# Patient Record
Sex: Female | Born: 1935 | Race: White | Hispanic: No | Smoking: Never smoker
Health system: Southern US, Community
[De-identification: ages and names within clinical notes are randomized; demographics above are authoritative.]

## PROBLEM LIST (undated history)

## (undated) DIAGNOSIS — I10 Essential (primary) hypertension: Secondary | ICD-10-CM

## (undated) DIAGNOSIS — E7800 Pure hypercholesterolemia, unspecified: Secondary | ICD-10-CM

## (undated) HISTORY — PX: NO PAST SURGERIES: SHX2092

---

## 2003-11-29 ENCOUNTER — Other Ambulatory Visit: Admission: RE | Admit: 2003-11-29 | Discharge: 2003-11-29 | Payer: Self-pay | Admitting: Family Medicine

## 2004-02-06 ENCOUNTER — Emergency Department (HOSPITAL_COMMUNITY): Admission: EM | Admit: 2004-02-06 | Discharge: 2004-02-06 | Payer: Self-pay | Admitting: Emergency Medicine

## 2005-08-11 ENCOUNTER — Emergency Department (HOSPITAL_COMMUNITY): Admission: EM | Admit: 2005-08-11 | Discharge: 2005-08-11 | Payer: Self-pay | Admitting: Emergency Medicine

## 2007-03-27 ENCOUNTER — Emergency Department (HOSPITAL_COMMUNITY): Admission: EM | Admit: 2007-03-27 | Discharge: 2007-03-27 | Payer: Self-pay | Admitting: Emergency Medicine

## 2007-06-19 ENCOUNTER — Encounter: Admission: RE | Admit: 2007-06-19 | Discharge: 2007-06-19 | Payer: Self-pay | Admitting: Orthopedic Surgery

## 2007-06-22 ENCOUNTER — Ambulatory Visit (HOSPITAL_BASED_OUTPATIENT_CLINIC_OR_DEPARTMENT_OTHER): Admission: RE | Admit: 2007-06-22 | Discharge: 2007-06-22 | Payer: Self-pay | Admitting: Orthopedic Surgery

## 2011-03-12 NOTE — Op Note (Signed)
NAMEENIS, LEATHERWOOD               ACCOUNT NO.:  [id number]   MEDICAL RECORD NO.:  [id number]          PATIENT TYPE:  AMB   LOCATION:  DSC                          FACILITY:  [facility]   PHYSICIAN:  Feliberto Gottron. Turner Daniels, M.D.   DATE OF BIRTH:  February 18, 1933   DATE OF PROCEDURE:  06/22/2007  DATE OF DISCHARGE:                               OPERATIVE REPORT   PREOPERATIVE DIAGNOSIS:  Chondromalacia with lateral meniscal tear,  right knee.   POSTOPERATIVE DIAGNOSES:  Chondromalacia with lateral meniscal tear,  right knee; with the addition of a medial meniscus posterior horn tear  and some cartilage loose bodies.  Chondromalacia was grade 4 to the end  of the medial femoral condyle,  grade 3 to the lateral tibial plateau,  grade 2-3 to the patella and grade 2-3 to the trochlea.   PROCEDURES:  1. Right knee arthroscopic partial medial and lateral meniscectomy.  2. Removal of loose bodies.  3. Debridement of chondromalacia flap tears from the lateral tibial      plateau, patella and trochlea.   SURGEON:  Feliberto Gottron. Turner Daniels, M.D.   FIRST ASSISTANT:  Skip Mayer PA-C.   ANESTHETIC:  General endotracheal.   ESTIMATED BLOOD LOSS:  Minimal.   FLUID REPLACEMENT:  800 mL crystalloid.   DRAINS PLACED:  None.   TOURNIQUET TIME:  None.   INDICATIONS FOR PROCEDURE:  A 77 year old woman with medial and lateral  joint line pain of her right knee.  MRI scan was consistent with a  fairly impressive lateral meniscal tear, as well as some chondromalacia.  On x-ray she has lost about half of her cartilage height.  She is not  interested in a knee replacement and desires elective arthroscopic  debridement of her right knee to decrease pain and increase function.  The risks and benefits of surgery were discussed, questions answered.   DESCRIPTION OF PROCEDURE:  The patient identified by armband, taken to  the operating room at [facility].  Appropriate  anesthetic monitors were attached  and general endotracheal anesthesia  was induced, with the patient in the supine position.  A lateral post  was applied to the table.  The right lower extremity was prepped and  draped in usual sterile fashion from the ankle to the mid thigh.  Using  a #11 blade, a standard inferomedial and inferolateral peripatellar  portals were then made, allowing introduction of the arthroscope through  the inferolateral portal and the outflow through the inferomedial  portal.  Diagnostic arthroscopy revealed grade 2-3 chondromalacia of the  apex of the patella and trochlea.  This was debrided back to a stable  margin with a 3.5 gator sucker shaver.  Moving into the medial  compartment, the distal aspect of the medial femoral condyle was down to  bare bone.  There were some flap tears as we went from distal to  posterior.  These were debrided back to a stable margin.  the posterior  horn of the medial meniscus had some radial tears that were debrided  back to a stable margin; and some cartilaginous loose bodies were  also  removed.  The ACL and the PCL were grossly intact on the lateral side;  grade 3 chondromalacia with flap tears to the lateral tibial plateau.  This was debrided back to a stable margin with a 3.5 gator sucker shaver  and a 4.2 great white sucker shaver.  Complex tearing of the lateral and  posterior horns of the lateral meniscus were debrided back to stable  margins with the large and small biter, and the gator and 4.2 great  white sucker shaver.  The gutters were cleared medially and laterally.  The ACL and PCL were probed and found to be intact.  Some small bleeders  in the synovium were identified and cauterized with a hooked Bovie.  The  knee was irrigated out with normal saline solution.  The arthroscopic  instruments were removed, and a dressing of Xeroform 4x4 dressing  sponges, Webril and an Ace wrap were applied.  The patient was then  awakened and taken to the recovery room  without difficulty.      Feliberto Gottron. Turner Daniels, M.D.  Electronically Signed     FJR/MEDQ  D:  06/22/2007  T:  06/22/2007  Job:  [id number]

## 2011-05-26 ENCOUNTER — Encounter: Payer: Self-pay | Admitting: *Deleted

## 2011-05-26 ENCOUNTER — Emergency Department (HOSPITAL_BASED_OUTPATIENT_CLINIC_OR_DEPARTMENT_OTHER)
Admission: EM | Admit: 2011-05-26 | Discharge: 2011-05-27 | Disposition: A | Discharge status: Home / Self Care | Payer: Medicare Other | Attending: Emergency Medicine | Admitting: Emergency Medicine

## 2011-05-26 DIAGNOSIS — M8448XA Pathological fracture, other site, initial encounter for fracture: Secondary | ICD-10-CM | POA: Insufficient documentation

## 2011-05-26 DIAGNOSIS — S32020A Wedge compression fracture of second lumbar vertebra, initial encounter for closed fracture: Secondary | ICD-10-CM

## 2011-05-26 DIAGNOSIS — I10 Essential (primary) hypertension: Secondary | ICD-10-CM | POA: Insufficient documentation

## 2011-05-26 DIAGNOSIS — Z9181 History of falling: Secondary | ICD-10-CM

## 2011-05-26 DIAGNOSIS — E7800 Pure hypercholesterolemia, unspecified: Secondary | ICD-10-CM | POA: Insufficient documentation

## 2011-05-26 HISTORY — DX: Pure hypercholesterolemia, unspecified: E78.00

## 2011-05-26 HISTORY — DX: Essential (primary) hypertension: I10

## 2011-05-26 NOTE — ED Provider Notes (Signed)
History     Chief Complaint  Patient presents with  . Fall   HPI Comments: Complains of pain in low back.  No radiation into legs.  No bowel or bladder complaints.   Patient is a 77 y.o. female presenting with fall. The history is provided by the patient.  Fall The accident occurred 3 to 5 hours ago. Incident: tangled in dog's leash, fell onto buttocks. Distance fallen: from standing. She landed on grass. Point of impact: buttocks.    Past Medical History  Diagnosis Date  . Hypertension   . High cholesterol     No past surgical history on file.  No family history on file.  History  Substance Use Topics  . Smoking status: Never Smoker   . Smokeless tobacco: Not on file  . Alcohol Use: No    OB History    Grav Para Term Preterm Abortions TAB SAB Ect Mult Living                  Review of Systems  Gastrointestinal: Negative.   Genitourinary: Negative.   Musculoskeletal:       As per hpi.  Neurological: Negative.   All other systems reviewed and are negative.    Physical Exam  BP 225/93  Pulse 68  Temp(Src) 98.6 F (37 C) (Oral)  Resp 20  SpO2 98%  Physical Exam  ED Course  Procedures  MDM Xrays show a mild L2 compression fracture with no signs of neuro compromise.  Will treat with pain meds, discharge to home.      Geoffery Lyons, MD 05/27/11 [phone]

## 2011-05-26 NOTE — ED Notes (Signed)
Got tangled in her dogs leash. Fell backward onto her buttocks. C.o pressure to her buttocks.

## 2011-05-27 ENCOUNTER — Emergency Department (INDEPENDENT_AMBULATORY_CARE_PROVIDER_SITE_OTHER): Payer: Medicare Other

## 2011-05-27 ENCOUNTER — Encounter (HOSPITAL_BASED_OUTPATIENT_CLINIC_OR_DEPARTMENT_OTHER): Payer: Self-pay | Admitting: *Deleted

## 2011-05-27 DIAGNOSIS — S32009A Unspecified fracture of unspecified lumbar vertebra, initial encounter for closed fracture: Secondary | ICD-10-CM

## 2011-05-27 DIAGNOSIS — N949 Unspecified condition associated with female genital organs and menstrual cycle: Secondary | ICD-10-CM

## 2011-05-27 DIAGNOSIS — W19XXXA Unspecified fall, initial encounter: Secondary | ICD-10-CM

## 2011-05-27 DIAGNOSIS — M47817 Spondylosis without myelopathy or radiculopathy, lumbosacral region: Secondary | ICD-10-CM

## 2011-05-27 MED ORDER — HYDROCODONE-ACETAMINOPHEN 5-325 MG PO TABS
2.0000 | ORAL_TABLET | Freq: Once | ORAL | Status: AC
  Administered 2011-05-27: 2 via ORAL
  Filled 2011-05-27: qty 2

## 2011-05-27 MED ORDER — HYDROCODONE-ACETAMINOPHEN 5-500 MG PO TABS: 1.0000 | ORAL_TABLET | Freq: Four times a day (QID) | ORAL | Status: AC | PRN

## 2011-05-27 NOTE — ED Notes (Signed)
Pt got tripped up last Pm and fell onto her buttocks pt presents with low back pain

## 2011-08-09 LAB — POCT HEMOGLOBIN-HEMACUE
Hemoglobin: 15.5 — ABNORMAL HIGH
Operator id: 112821

## 2011-08-09 LAB — BASIC METABOLIC PANEL
BUN: 21
CO2: 31
Calcium: 9.3
Chloride: 104
Creatinine, Ser: 0.99
GFR calc Af Amer: >60
GFR calc non Af Amer: 55 — ABNORMAL LOW
Glucose, Bld: 167 — ABNORMAL HIGH
Potassium: 4.4
Sodium: 141

## 2011-08-26 ENCOUNTER — Other Ambulatory Visit (HOSPITAL_BASED_OUTPATIENT_CLINIC_OR_DEPARTMENT_OTHER): Payer: Self-pay | Admitting: Family Medicine

## 2011-08-26 DIAGNOSIS — R410 Disorientation, unspecified: Secondary | ICD-10-CM

## 2011-08-27 ENCOUNTER — Other Ambulatory Visit (HOSPITAL_BASED_OUTPATIENT_CLINIC_OR_DEPARTMENT_OTHER): Payer: Medicare Other

## 2011-08-31 ENCOUNTER — Ambulatory Visit (HOSPITAL_BASED_OUTPATIENT_CLINIC_OR_DEPARTMENT_OTHER)
Admission: RE | Admit: 2011-08-31 | Discharge: 2011-08-31 | Disposition: A | Discharge status: Home / Self Care | Payer: Medicare Other | Source: Ambulatory Visit | Attending: Family Medicine | Admitting: Family Medicine

## 2011-08-31 DIAGNOSIS — R4789 Other speech disturbances: Secondary | ICD-10-CM | POA: Insufficient documentation

## 2011-08-31 DIAGNOSIS — I679 Cerebrovascular disease, unspecified: Secondary | ICD-10-CM | POA: Insufficient documentation

## 2011-08-31 DIAGNOSIS — R410 Disorientation, unspecified: Secondary | ICD-10-CM

## 2011-08-31 DIAGNOSIS — R413 Other amnesia: Secondary | ICD-10-CM | POA: Insufficient documentation

## 2011-08-31 DIAGNOSIS — Z8673 Personal history of transient ischemic attack (TIA), and cerebral infarction without residual deficits: Secondary | ICD-10-CM | POA: Insufficient documentation

## 2011-08-31 DIAGNOSIS — I6789 Other cerebrovascular disease: Secondary | ICD-10-CM

## 2011-08-31 DIAGNOSIS — F29 Unspecified psychosis not due to a substance or known physiological condition: Secondary | ICD-10-CM | POA: Insufficient documentation

## 2012-05-12 ENCOUNTER — Inpatient Hospital Stay (HOSPITAL_COMMUNITY): Payer: Medicare Other

## 2012-05-12 ENCOUNTER — Inpatient Hospital Stay (HOSPITAL_COMMUNITY)
Admission: AD | Admit: 2012-05-12 | Discharge: 2012-05-15 | DRG: 064 | Disposition: A | Discharge status: Home / Self Care | Payer: Medicare Other | Source: Ambulatory Visit | Attending: Internal Medicine | Admitting: Internal Medicine

## 2012-05-12 ENCOUNTER — Encounter (HOSPITAL_COMMUNITY): Payer: Self-pay | Admitting: Internal Medicine

## 2012-05-12 DIAGNOSIS — I10 Essential (primary) hypertension: Secondary | ICD-10-CM | POA: Diagnosis present

## 2012-05-12 DIAGNOSIS — M6281 Muscle weakness (generalized): Secondary | ICD-10-CM

## 2012-05-12 DIAGNOSIS — G936 Cerebral edema: Secondary | ICD-10-CM | POA: Diagnosis present

## 2012-05-12 DIAGNOSIS — E860 Dehydration: Secondary | ICD-10-CM | POA: Diagnosis present

## 2012-05-12 DIAGNOSIS — R29898 Other symptoms and signs involving the musculoskeletal system: Secondary | ICD-10-CM | POA: Diagnosis present

## 2012-05-12 DIAGNOSIS — I951 Orthostatic hypotension: Secondary | ICD-10-CM | POA: Clinically undetermined

## 2012-05-12 DIAGNOSIS — I519 Heart disease, unspecified: Secondary | ICD-10-CM | POA: Diagnosis present

## 2012-05-12 DIAGNOSIS — E119 Type 2 diabetes mellitus without complications: Secondary | ICD-10-CM | POA: Diagnosis present

## 2012-05-12 DIAGNOSIS — Z7982 Long term (current) use of aspirin: Secondary | ICD-10-CM

## 2012-05-12 DIAGNOSIS — I619 Nontraumatic intracerebral hemorrhage, unspecified: Principal | ICD-10-CM | POA: Diagnosis present

## 2012-05-12 DIAGNOSIS — E7800 Pure hypercholesterolemia, unspecified: Secondary | ICD-10-CM | POA: Diagnosis present

## 2012-05-12 LAB — COMPREHENSIVE METABOLIC PANEL
AST: 21 U/L (ref 0–37)
Albumin: 3.3 g/dL — ABNORMAL LOW (ref 3.5–5.2)
Calcium: 9.1 mg/dL (ref 8.4–10.5)
Creatinine, Ser: 0.93 mg/dL (ref 0.50–1.10)
Total Protein: 6.4 g/dL (ref 6.0–8.3)

## 2012-05-12 LAB — GLUCOSE, CAPILLARY: Glucose-Capillary: 215 mg/dL — ABNORMAL HIGH (ref 70–99)

## 2012-05-12 LAB — PHOSPHORUS: Phosphorus: 2.5 mg/dL (ref 2.3–4.6)

## 2012-05-12 LAB — CBC
MCH: 30.4 pg (ref 26.0–34.0)
MCHC: 34.1 g/dL (ref 30.0–36.0)
Platelets: 152 10*3/uL (ref 150–400)

## 2012-05-12 LAB — MAGNESIUM: Magnesium: 1.9 mg/dL (ref 1.5–2.5)

## 2012-05-12 LAB — MRSA PCR SCREENING: MRSA by PCR: NEGATIVE

## 2012-05-12 MED ORDER — HYDROCHLOROTHIAZIDE 25 MG PO TABS
25.0000 mg | ORAL_TABLET | Freq: Every day | ORAL | Status: DC
  Administered 2012-05-12: 25 mg via ORAL
  Filled 2012-05-12 (×2): qty 1

## 2012-05-12 MED ORDER — SODIUM CHLORIDE 0.9 % IV SOLN: 250.0000 mL | INTRAVENOUS | Status: DC | PRN

## 2012-05-12 MED ORDER — INSULIN ASPART 100 UNIT/ML ~~LOC~~ SOLN
0.0000 [IU] | Freq: Three times a day (TID) | SUBCUTANEOUS | Status: DC
  Administered 2012-05-13 – 2012-05-14 (×2): 1 [IU] via SUBCUTANEOUS

## 2012-05-12 MED ORDER — ATORVASTATIN CALCIUM 40 MG PO TABS
40.0000 mg | ORAL_TABLET | Freq: Every day | ORAL | Status: DC
  Administered 2012-05-12 – 2012-05-15 (×4): 40 mg via ORAL
  Filled 2012-05-12 (×4): qty 1

## 2012-05-12 MED ORDER — SODIUM CHLORIDE 0.9 % IJ SOLN: 3.0000 mL | INTRAMUSCULAR | Status: DC | PRN

## 2012-05-12 MED ORDER — SODIUM CHLORIDE 0.9 % IJ SOLN: 3.0000 mL | Freq: Two times a day (BID) | INTRAMUSCULAR | Status: DC

## 2012-05-12 MED ORDER — ATENOLOL 100 MG PO TABS
100.0000 mg | ORAL_TABLET | Freq: Every day | ORAL | Status: DC
  Administered 2012-05-12: 100 mg via ORAL
  Filled 2012-05-12 (×2): qty 1

## 2012-05-12 MED ORDER — HYDRALAZINE HCL 20 MG/ML IJ SOLN: 10.0000 mg | Freq: Three times a day (TID) | INTRAMUSCULAR | Status: DC | PRN

## 2012-05-12 MED ORDER — SENNOSIDES-DOCUSATE SODIUM 8.6-50 MG PO TABS
1.0000 | ORAL_TABLET | Freq: Every evening | ORAL | Status: DC | PRN
  Filled 2012-05-12: qty 1

## 2012-05-12 MED ORDER — ASPIRIN 81 MG PO CHEW
81.0000 mg | CHEWABLE_TABLET | Freq: Every day | ORAL | Status: DC
  Administered 2012-05-12: 81 mg via ORAL
  Filled 2012-05-12: qty 1

## 2012-05-12 MED ORDER — HYDRALAZINE HCL 20 MG/ML IJ SOLN
10.0000 mg | Freq: Four times a day (QID) | INTRAMUSCULAR | Status: DC | PRN
  Administered 2012-05-13: 10 mg via INTRAVENOUS
  Filled 2012-05-12: qty 1

## 2012-05-12 MED ORDER — ENOXAPARIN SODIUM 30 MG/0.3ML ~~LOC~~ SOLN
30.0000 mg | SUBCUTANEOUS | Status: DC
  Administered 2012-05-12: 30 mg via SUBCUTANEOUS
  Filled 2012-05-12: qty 0.3

## 2012-05-12 NOTE — H&P (Addendum)
[facility] History and Physical  Theresa Sexton AVW:[id number] DOB: May 26, 1933 DOA: 05/12/2012   PCP: Joycelyn Rua, MD   Chief Complaint: Patient transfer from Dr.  Lewie Loron office for work up stroke. Patient with BP 246/110 at his office.   HPI:  78 year old with PMH significant for HTN, DM, hypercholesterolemia, who was refer to Dr Everlene Balls for stroke work up. Patient has has right hand weakness, dropping objects, and right arm numbness since 4 weeks. She was at Dr Everlene Balls office today and was  found to have blood pressure  at 240/110. She was refer for admission for malignant HTN and work up for stroke. Patient confirm weakness or right hand and numbness. She denies slurred speech, dysphagia, lower extremities weakness, vision changes, chest pain, dyspnea. She denies headache, neck pain.    Review of Systems:  Constitutional:  No weight loss, night sweats, Fevers, chills, fatigue.  HEENT:  No headaches, Difficulty swallowing,Tooth/dental problems,Sore throat,  No sneezing, itching, ear ache, nasal congestion, post nasal drip,  Cardio-vascular:  No chest pain, Orthopnea, PND, swelling in lower extremities, anasarca, dizziness, palpitations  GI:  No heartburn, indigestion, abdominal pain, nausea, vomiting, diarrhea, change in bowel habits, loss of appetite  Resp:  No shortness of breath with exertion or at rest. No excess mucus, no productive cough, No non-productive cough, No coughing up of blood.No change in color of mucus.No wheezing.No chest wall deformity  Skin:  no rash or lesions.  GU:  no dysuria, change in color of urine, no urgency or frequency. No flank pain.  Musculoskeletal:  No joint pain or swelling. No decreased range of motion. No back pain.  Psych:  No change in mood or affect. No depression or anxiety. No memory loss.    Past Medical History  Diagnosis Date  . Hypertension   . High cholesterol   . Diabetes mellitus    History reviewed. No  pertinent past surgical history. Social History:  reports that she has never smoked. She does not have any smokeless tobacco history on file. She reports that she does not drink alcohol or use illicit drugs.  Allergies  Allergen Reactions  . Penicillins Other (See Comments)    Reaction unkown    Family history: Mother deceased, natural.                           Father: Deceased MI, 23.   Prior to Admission medications   Medication Sig Start Date End Date Taking? Authorizing Provider  aspirin 81 MG chewable tablet Chew 81 mg by mouth daily.   Yes Historical Provider, MD  atenolol (TENORMIN) 100 MG tablet Take 100 mg by mouth daily.   Yes Historical Provider, MD  atorvastatin (LIPITOR) 40 MG tablet Take 40 mg by mouth daily.   Yes Historical Provider, MD   Physical Exam: Filed Vitals:   05/12/12 1400  BP: 195/71  Pulse: 68  Temp: 97.7 F (36.5 C)  TempSrc: Oral  Resp: 13  Height: 5' 4.5" (1.638 m)  Weight: 75.8 kg (167 lb 1.7 oz)  SpO2: 94%   BP 195/71  Pulse 68  Temp 97.7 F (36.5 C) (Oral)  Resp 13  Ht 5' 4.5" (1.638 m)  Wt 75.8 kg (167 lb 1.7 oz)  BMI 28.24 kg/m2  SpO2 94%  General Appearance:    Alert, cooperative, no distress, appears stated age  Head:    Normocephalic, without obvious abnormality, atraumatic  Eyes:    PERRL,  conjunctiva/corneas clear, EOM's intact, fundi    Benign.     Nose:   Nares normal, septum midline, mucosa normal, no drainage    or sinus tenderness  Throat:   Lips, mucosa, and tongue normal; teeth and gums normal  Neck:   Supple, symmetrical, trachea midline, no adenopathy;    thyroid:  no enlargement/tenderness/nodules; no carotid   bruit or JVD     Lungs:     Clear to auscultation bilaterally, respirations unlabored      Heart:    Regular rate and rhythm, S1 and S2 normal, no murmur, rub   or gallop     Abdomen:     Soft, non-tender, bowel sounds active all four quadrants,    no masses, no organomegaly        Extremities:    Extremities normal, atraumatic, no cyanosis or edema  Pulses:   2+ and symmetric all extremities  Skin:   Skin color, texture, turgor normal, no rashes or lesions  Lymph nodes:   Cervical, supraclavicular, and axillary nodes normal  Neurologic:   CNII-XII intact, sensation and reflexes    Throughout, left side 5/5, Right LE 5/5, good right hand grip, right arm 5-/5 .    Labs on Admission:  Basic Metabolic Panel: No results found for this basename: NA:5,K:2,CL:5,CO2:5,GLUCOSE:5,BUN:5,CREATININE:5,CALCIUM:5,MG:5,PHOS:5 in the last 168 hours Liver Function Tests: No results found for this basename: AST:5,ALT:5,ALKPHOS:5,BILITOT:5,PROT:5,ALBUMIN:5 in the last 168 hours No results found for this basename: LIPASE:5,AMYLASE:5 in the last 168 hours No results found for this basename: AMMONIA:5 in the last 168 hours CBC: No results found for this basename: WBC:5,NEUTROABS:5,HGB:5,HCT:5,MCV:5,PLT:5 in the last 168 hours Cardiac Enzymes: No results found for this basename: CKTOTAL:5,CKMB:5,CKMBINDEX:5,TROPONINI:5 in the last 168 hours BNP: No components found with this basename: POCBNP:5 CBG: No results found for this basename: GLUCAP:5 in the last 168 hours  Radiological Exams on Admission: No results found.  EKG: ordered.   Assessment/Plan:   1-HTN (hypertension), malignant: Patient BP at neurology office was at 246/110. Blood pressure here 195/71. She didn't take her BP medications today. I will resume atenolol. I will start HCTZ. I will order Hydralazine IV PRN for SBP more than 200. No worsening neuro symptoms.    2-Diabetes mellitus: Patient is not on medications for Diabetes. I will order Hb-A1c. SSI as needed.    3-Hypercholesterolemia: Fasting lipid panel. Continue with Lipitor.    4-Right hand weakness, ? Stroke: Will do work up for stroke. MRI/MRA brain. Lipid panel, carotid doppler, ECHO, Hb-A1c. PT, OT consult. Swallow evaluation. Her symptoms started 4 weeks ago no indication  for TPA.    Time spend: 35 minutes.  Family Communication: Family at bedside. Plan of care informed to family.    Hartley Barefoot, MD  [facility] Pager [phone]  If 7PM-7AM, please contact night-coverage [email or web address] Password [facility] 05/12/2012, 3:07 PM     EKG: with diffuse T wave inversion, prolong QT: Check Mg, K, Phosphorus, cardiac enzymes. Patient denies chest pain.

## 2012-05-12 NOTE — Progress Notes (Signed)
  Echocardiogram 2D Echocardiogram has been performed.  Theresa Sexton 05/12/2012, 4:45 PM

## 2012-05-12 NOTE — Consult Note (Signed)
Reason for Consult: Right arm weakness and malignant hypertension Referring Physician: Dr. Sunnie Nielsen  CC: Right hand weakness for 4 weeks.  HPI: Theresa Sexton is an 78 y.o. female who was seen by Dr. Joycelyn Rua, primary MD, and then referred to Dr. Marjory Lies for workup of stroke-type symptoms. Over the past 4 weeks the patient has noticed right hand weakness where she uncontrollably drops things. She denies any other extremity weakness, speech or thought process problems. No shaking. Family in room and confirms story. She denies any history of stroke but has significant risk factors.  She saw her neurologist as an outpatient today with bp readings of 240/110 and was admitted to the step-down unit for close monitoring. Last bp reading was 195/71 at 2pm.  Past Medical History  Diagnosis Date  . Hypertension   . High cholesterol   . Diabetes mellitus     Social History: Denies any tobacco, etoh, or illicit drug use.  Allergies  Allergen Reactions  . Penicillins Other (See Comments)    Reaction unkown    Medications:  Prior to Admission:  Prescriptions prior to admission  Medication Sig Dispense Refill  . aspirin 81 MG chewable tablet Chew 81 mg by mouth daily.      Marland Kitchen atenolol (TENORMIN) 100 MG tablet Take 100 mg by mouth daily.      Marland Kitchen atorvastatin (LIPITOR) 40 MG tablet Take 40 mg by mouth daily.       Scheduled:   . aspirin  81 mg Oral Daily  . atenolol  100 mg Oral Daily  . atorvastatin  40 mg Oral Daily  . enoxaparin (LOVENOX) injection  30 mg Subcutaneous Q24H  . hydrochlorothiazide  25 mg Oral Daily  . insulin aspart  0-9 Units Subcutaneous TID WC  . sodium chloride  3 mL Intravenous Q12H   Continuous:  ZOX:WRUEAV chloride, hydrALAZINE, senna-docusate, sodium chloride  WUJ:WJXBJYN obtained from chart review and the patient  General ROS: negative for - chills, fatigue, fever, night sweats, weight gain or weight loss Psychological ROS: negative for - behavioral  disorder, hallucinations, memory difficulties, mood swings or suicidal ideation Ophthalmic ROS: negative for - blurry vision, double vision, eye pain or loss of vision ENT ROS: negative for - epistaxis, nasal discharge, oral lesions, sore throat, tinnitus or vertigo Allergy and Immunology ROS: negative for - hives or itchy/watery eyes Hematological and Lymphatic ROS: negative for - bleeding problems, bruising or swollen lymph nodes Endocrine ROS: negative for - galactorrhea, hair pattern changes, polydipsia/polyuria or temperature intolerance Respiratory ROS: negative for - cough, hemoptysis, shortness of breath or wheezing Cardiovascular ROS: negative for - chest pain, dyspnea on exertion, edema or irregular heartbeat Gastrointestinal ROS: negative for - abdominal pain, diarrhea, hematemesis, nausea/vomiting or stool incontinence Genito-Urinary ROS: negative for - dysuria, hematuria, incontinence or urinary frequency/urgency Musculoskeletal ROS: negative for - joint swelling or muscular weakness Neurological ROS: as noted in HPI Dermatological ROS: negative for rash and skin lesion changes    Physical Examination: Blood pressure 195/71, pulse 68, temperature 97.7 F (36.5 C), temperature source Oral, resp. rate 13, height 5' 4.5" (1.638 m), weight 75.8 kg (167 lb 1.7 oz), SpO2 94.00%.  Neurologic Examination Mental Status: Alert, oriented, thought content appropriate.  Speech fluent without evidence of aphasia.  Able to follow 3 step commands without difficulty. Cranial Nerves: II: visual fields grossly normal, pupils equal, round, reactive to light and accommodation III,IV, VI: ptosis not present, extra-ocular motions intact bilaterally V,VII: smile symmetric, facial light touch sensation normal  bilaterally VIII: hearing normal bilaterally IX,X: gag reflex present XI: trapezius strength/neck flexion strength normal bilaterally XII: tongue strength normal  Motor: Right upper extremity:    5/5. No weakness noted on testing of grip strength, finger abduction, pincer grasp, wrist flexion or wrist extension.   Left upper extremity:   5/5 Right lower extremity:   5/5  Left lower extremity:   5/5 Tone and bulk: Normal tone throughout; no atrophy noted. Sensory: Pinprick and light touch intact throughout, bilaterally, except for dysesthesia to fine touch stimulation applied to ventral aspect of right hand. Deep Tendon Reflexes: 2+ and symmetric throughout. Plantars: Right: downgoing. Left: downgoing. Cerebellar: Normal finger-to-nose, normal rapid alternating movements and normal heel-to-shin test  Vascular exam: no subclavian artery bruit, left   Results for orders placed during the hospital encounter of 05/12/12 (from the past 48 hour(s))  MRSA PCR SCREENING     Status: Normal   Collection Time   05/12/12  2:52 PM      Component Value Range Comment   MRSA by PCR NEGATIVE  NEGATIVE     Recent Results (from the past 240 hour(s))  MRSA PCR SCREENING     Status: Normal   Collection Time   05/12/12  2:52 PM      Component Value Range Status Comment   MRSA by PCR NEGATIVE  NEGATIVE Final     Assessment: 78yo female with 4 week history of right hand weakness without injury. Seen at neurology office with malignant HTN and was admitted to step down unit for bp treatment and to eval for further stroke-like symptoms.  1) Right Hand Weakness. DDx includes carpal tunnel syndrome, right C8 or T1 radiculopathy, brachial plexus lesion, and small left subcortical stroke due to malignant HTN or embolism.  2) Malignant Hypertension 3) Diabetes Mellitus 4) Hyperlipidemia  Stroke Risk Factors include: malignant hypertension, hyperlipidemia, diabetes mellitus.  Recommendations: 1) Due to isolated hand weakness, would perform EMG/NCS. However, felt most likely to be secondary to a small stroke in the setting of malignant hypertension. 2) MR Brain, MR MRA Head/Brain Wo contrast  3) Restart  bp medications. HCTZ also added with prn hydralazine for sbp>200, dbp>110 4) Risk Factor Management of diabetes and hyperlipidemia. Check lipids, hgbA1c. 5) 2D Echo, carotid doppler  6) DVT prophylaxis: SCD's for now, then Lovenox if CT negative for hemorrhage. 7) Hold ASA until CT obtained.     Job Founds, MBA, MHA [facility] Pager [phone]  05/12/2012, 4:38 PM    Electronically signed: Dr. Caryl Pina

## 2012-05-13 ENCOUNTER — Inpatient Hospital Stay (HOSPITAL_COMMUNITY): Payer: Medicare Other

## 2012-05-13 DIAGNOSIS — E860 Dehydration: Secondary | ICD-10-CM | POA: Diagnosis present

## 2012-05-13 DIAGNOSIS — I951 Orthostatic hypotension: Secondary | ICD-10-CM

## 2012-05-13 DIAGNOSIS — I6350 Cerebral infarction due to unspecified occlusion or stenosis of unspecified cerebral artery: Secondary | ICD-10-CM

## 2012-05-13 LAB — CARDIAC PANEL(CRET KIN+CKTOT+MB+TROPI)
CK, MB: 0.1 ng/mL — ABNORMAL LOW (ref 0.3–4.0)
CK, MB: 0.4 ng/mL (ref 0.3–4.0)
Relative Index: INVALID (ref 0.0–2.5)
Troponin I: <0.3 ng/mL (ref <0.30)
Troponin I: <0.3 ng/mL (ref <0.30)

## 2012-05-13 LAB — CBC
HCT: 48.3 % — ABNORMAL HIGH (ref 36.0–46.0)
MCH: 30.7 pg (ref 26.0–34.0)
MCHC: 34.2 g/dL (ref 30.0–36.0)
MCV: 89.9 fL (ref 78.0–100.0)
WBC: 9.3 10*3/uL (ref 4.0–10.5)

## 2012-05-13 LAB — GLUCOSE, CAPILLARY
Glucose-Capillary: 128 mg/dL — ABNORMAL HIGH (ref 70–99)
Glucose-Capillary: 118 mg/dL — ABNORMAL HIGH (ref 70–99)
Glucose-Capillary: 123 mg/dL — ABNORMAL HIGH (ref 70–99)

## 2012-05-13 LAB — BASIC METABOLIC PANEL
Calcium: 9.5 mg/dL (ref 8.4–10.5)
Creatinine, Ser: 0.83 mg/dL (ref 0.50–1.10)
GFR calc Af Amer: 77 mL/min — ABNORMAL LOW (ref >90)

## 2012-05-13 LAB — LIPID PANEL
Cholesterol: 155 mg/dL (ref 0–200)
VLDL: 19 mg/dL (ref 0–40)

## 2012-05-13 LAB — CORTISOL-AM, BLOOD: Cortisol - AM: 16.5 ug/dL (ref 4.3–22.4)

## 2012-05-13 LAB — HEMOGLOBIN A1C
Hgb A1c MFr Bld: 6.5 % — ABNORMAL HIGH (ref <5.7)
Mean Plasma Glucose: 140 mg/dL — ABNORMAL HIGH (ref <117)

## 2012-05-13 MED ORDER — ATENOLOL 50 MG PO TABS
50.0000 mg | ORAL_TABLET | Freq: Every day | ORAL | Status: DC
  Administered 2012-05-13: 50 mg via ORAL
  Filled 2012-05-13: qty 1

## 2012-05-13 MED ORDER — LORAZEPAM 2 MG/ML IJ SOLN
1.0000 mg | Freq: Once | INTRAMUSCULAR | Status: AC | PRN
  Administered 2012-05-13: 1 mg via INTRAVENOUS

## 2012-05-13 MED ORDER — SODIUM CHLORIDE 0.9 % IV SOLN: 250.0000 mL | INTRAVENOUS | Status: DC | PRN

## 2012-05-13 MED ORDER — SODIUM CHLORIDE 0.9 % IV SOLN
INTRAVENOUS | Status: DC
  Administered 2012-05-13: 23:00:00 via INTRAVENOUS

## 2012-05-13 MED ORDER — SODIUM CHLORIDE 0.9 % IV BOLUS (SEPSIS)
500.0000 mL | Freq: Once | INTRAVENOUS | Status: AC
  Administered 2012-05-13: 10:00:00 via INTRAVENOUS

## 2012-05-13 MED ORDER — HYDRALAZINE HCL 20 MG/ML IJ SOLN
10.0000 mg | INTRAMUSCULAR | Status: DC | PRN
  Administered 2012-05-15: 10 mg via INTRAVENOUS
  Filled 2012-05-13 (×2): qty 0.5

## 2012-05-13 MED ORDER — LORAZEPAM 2 MG/ML IJ SOLN
INTRAMUSCULAR | Status: AC
  Filled 2012-05-13: qty 1

## 2012-05-13 MED ORDER — METOPROLOL TARTRATE 50 MG PO TABS
50.0000 mg | ORAL_TABLET | Freq: Two times a day (BID) | ORAL | Status: DC
  Administered 2012-05-13 – 2012-05-15 (×4): 50 mg via ORAL
  Filled 2012-05-13 (×6): qty 1

## 2012-05-13 MED ORDER — HYDRALAZINE HCL 20 MG/ML IJ SOLN
10.0000 mg | INTRAMUSCULAR | Status: DC | PRN
  Administered 2012-05-13: 10 mg via INTRAVENOUS
  Filled 2012-05-13: qty 1

## 2012-05-13 NOTE — Progress Notes (Signed)
Occupational Therapy Evaluation Patient Details Name: Theresa Sexton MRN: [id number] DOB: 1932-11-30 Today's Date: 05/13/2012 Time: 4098-1191 OT Time Calculation (min): 27 min  OT Assessment / Plan / Recommendation Clinical Impression  78 y.o. pt. admitted with Rt. hand weakness/numbness that persisted for four weeks. Found small hemorhage in Lt. pons. Strength in Rt. hand was [facility]. Pt. had slight decreased balance in evaluation.  Daughter works and may not be able to assist if pt is not I.. PT would benefit from OT to maximize independence and safety in ADLs prior to d/c. OT to follow acutely.    OT Assessment  Patient needs continued OT Services    Follow Up Recommendations  No OT follow up    Barriers to Discharge      Equipment Recommendations  None recommended by OT    Recommendations for Other Services    Frequency  Min 2X/week    Precautions / Restrictions Precautions Precautions: Fall   Pertinent Vitals/Pain See vitals for BP. No pain reported.     ADL  Grooming: Simulated;Modified independent Where Assessed - Grooming: Unsupported sitting Lower Body Dressing: Simulated;Modified independent Where Assessed - Lower Body Dressing: Unsupported sitting Toilet Transfer: Simulated;Supervision/safety Toilet Transfer Method: Sit to stand Toilet Transfer Equipment: Regular height toilet ADL Comments: Pt. able to sit EOB and simulate LB dressing (socks) with Mod I. Pt. did not c/o dizziness in session. OT monitored BP and it was stable. Pt. reported having difficulty holding onto items with Rt. hand. Pt. had slight decreased balance when walking in room.     OT Diagnosis: Generalized weakness  OT Problem List: Decreased strength;Impaired balance (sitting and/or standing);Impaired UE functional use OT Treatment Interventions: Self-care/ADL training;Patient/family education;Balance training   OT Goals Acute Rehab OT Goals OT Goal Formulation: With patient Time For Goal  Achievement: 05/27/12 Potential to Achieve Goals: Good ADL Goals Pt Will Perform Grooming: Standing at sink;Independently ADL Goal: Grooming - Progress: Goal set today Pt Will Perform Upper Body Dressing: Independently;Sitting, bed;Sitting, chair ADL Goal: Upper Body Dressing - Progress: Goal set today Pt Will Perform Lower Body Dressing: Independently;Sit to stand from bed;Sit to stand from chair ADL Goal: Lower Body Dressing - Progress: Goal set today Pt Will Transfer to Toilet: Independently;Ambulation;Regular height toilet ADL Goal: Toilet Transfer - Progress: Goal set today Pt Will Perform Toileting - Clothing Manipulation: Independently;Standing ADL Goal: Toileting - Clothing Manipulation - Progress: Goal set today Pt Will Perform Toileting - Hygiene: Independently;Sit to stand from 3-in-1/toilet ADL Goal: Toileting - Hygiene - Progress: Goal set today Miscellaneous OT Goals Miscellaneous OT Goal #1: Pt. will score 50 or above on BERG OT Goal: Miscellaneous Goal #1 - Progress: Goal set today  Visit Information  Last OT Received On: 05/13/12 Assistance Needed: +1    Subjective Data  Subjective: "Sometimes I drop things with my Rt. hand. My handwriting is terrible."   Prior Functioning  Vision/Perception  Home Living Lives With: Daughter (son lives 30 min away) Available Help at Discharge: Available PRN/intermittently Type of Home: House Home Access: Stairs to enter Secretary/administrator of Steps: 5 Entrance Stairs-Rails: Left;Right Home Layout: One level Bathroom Shower/Tub: Forensic scientist: Standard Bathroom Accessibility: Yes How Accessible: Accessible via walker Home Adaptive Equipment: None Prior Function Level of Independence: Independent Able to Take Stairs?: Yes Driving: Yes Vocation: Retired Musician: No difficulties Dominant Hand: Right      Cognition  Overall Cognitive Status: Appears within functional  limits for tasks assessed/performed Arousal/Alertness: Awake/alert Orientation Level: Appears intact  for tasks assessed Behavior During Session: [facility] for tasks performed    Extremity/Trunk Assessment Right Upper Extremity Assessment RUE ROM/Strength/Tone: Within functional levels RUE Sensation: WFL - Light Touch RUE Coordination: WFL - gross/fine motor Left Upper Extremity Assessment LUE ROM/Strength/Tone: Within functional levels LUE Sensation: WFL - Light Touch LUE Coordination: WFL - gross/fine motor Right Lower Extremity Assessment RLE ROM/Strength/Tone: Within functional levels RLE Sensation: WFL - Light Touch RLE Coordination: WFL - gross/fine motor Left Lower Extremity Assessment LLE ROM/Strength/Tone: Within functional levels (Bil mild weakness in proximal musculature) LLE Sensation: WFL - Light Touch LLE Coordination: WFL - gross/fine motor   Mobility Bed Mobility Bed Mobility: Supine to Sit;Sitting - Scoot to Edge of Bed Supine to Sit: 7: Independent Sitting - Scoot to Edge of Bed: 7: Independent Transfers Transfers: Sit to Stand;Stand to Sit Sit to Stand: 5: Supervision;With upper extremity assist;From bed Stand to Sit: 5: Supervision;With upper extremity assist;To bed        End of Session OT - End of Session Activity Tolerance: Patient tolerated treatment well Patient left: in bed (Tech in room for pt.)  GO    Pt educated on fine motor task to (A) with Rt hand increased motor planning.   Jenell Milliner 05/13/2012, 2:58 PM  I agree with the following treatment note after reviewing documentation.   Harrel Carina Medina   OTR/L Pager: [phone] Office: [phone] .

## 2012-05-13 NOTE — Progress Notes (Signed)
Utilization review completed.  
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
## 2012-05-13 NOTE — Progress Notes (Addendum)
[facility] PROGRESS NOTE    SUBJECTIVE   She has no complaints at this time other than some orthostatic dizziness when PT stood her up.  She again denies any paresthesia of right arm, hand or fingers.  States she "just drops things".  Over night her BP was elevated but at this time is back to 139/53.   OBJECTIVE   Vital signs in last 24 hours: Temp:  [97.5 F (36.4 C)-97.8 F (36.6 C)] 97.8 F (36.6 C) (07/17 0700) Pulse Rate:  [53-70] 70  (07/17 0900) Resp:  [13-24] 15  (07/17 0900) BP: (93-228)/(34-109) 139/53 mmHg (07/17 0920) SpO2:  [92 %-99 %] 96 % (07/17 0900) Weight:  [75.8 kg (167 lb 1.7 oz)] 75.8 kg (167 lb 1.7 oz) (07/16 1400)  Intake/Output from previous day: 07/16 0701 - 07/17 0700 In: -  Out: 1095 [Urine:1095] Intake/Output this shift: Total I/O In: -  Out: 200 [Urine:200] Nutritional status: Carb Control  Past Medical History  Diagnosis Date  . Hypertension   . High cholesterol   . Diabetes mellitus     Neurologic Exam:  Mental Status: Alert, oriented, thought content appropriate.  Speech fluent without evidence of aphasia.  Able to follow 3 step commands without difficulty. Cranial Nerves: II: visual fields grossly normal, pupils equal, round, reactive to light and accommodation III,IV, VI: ptosis not present, extraocular muscles extra-ocular motions intact bilaterally V,VII: smile symmetric, facial light touch sensation normal bilaterally VIII: hearing normal bilaterally XI: trapezius strength/neck flexion strength normal bilaterally XII: tongue strength normal  Motor: Right : Upper extremity    Left:     Upper extremity 5/5 deltoid       5/5 deltoid 5/5 tricep      5/5 tricep 5/5 biceps      5/5 biceps  5/5wrist flexion     5/5 wrist flexion 5/5 wrist extension     5/5 wrist extension 5/5 hand grip      5/5 hand grip  Lower extremity     Lower extremity 5/5 hip flexor      5/5 hip flexor 5/5  hip adductors     5/5 hip adductors 5/5 hip abductors     5/5 hip abductors 5/5 quadricep      5/5 quadriceps  5/5 hamstrings     5/5 hamstrings 5/5 plantar flexion       5/5 plantar flexion 5/5 plantar extension     5/5 plantar extension  Negative phalan's, negative tinnel's, negative spurling's maneuver  Tone and bulk:normal tone throughout; no atrophy noted in thenar eminence or in the intrinsic muscles.  Sensory: Pinprick and light touch intact throughout, bilaterally Deep Tendon Reflexes: 2+ and symmetric throughout Plantars: Right: downgoing   Left: downgoing Cerebellar: normal finger-to-nose, normal rapid alternating movements and normal heel-to-shin test normal gait and station   Lab Results: Results for orders placed during the hospital encounter of 05/12/12 (from the past 24 hour(s))  MRSA PCR SCREENING     Status: Normal   Collection Time   05/12/12  2:52 PM      Component Value Range   MRSA by PCR NEGATIVE  NEGATIVE  GLUCOSE, CAPILLARY     Status: Abnormal   Collection Time   05/12/12  4:44 PM      Component  Value Range   Glucose-Capillary 160 (*) 70 - 99 mg/dL   Comment 1 Notify RN     Comment 2 Documented in Chart    CBC     Status: Normal   Collection Time   05/12/12  5:09 PM      Component Value Range   WBC 8.9  4.0 - 10.5 K/uL   RBC 4.93  3.87 - 5.11 MIL/uL   Hemoglobin 15.0  12.0 - 15.0 g/dL   HCT 16.1  09.6 - 04.5 %   MCV 89.2  78.0 - 100.0 fL   MCH 30.4  26.0 - 34.0 pg   MCHC 34.1  30.0 - 36.0 g/dL   RDW 40.9  81.1 - 91.4 %   Platelets 152  150 - 400 K/uL  MAGNESIUM     Status: Normal   Collection Time   05/12/12  5:09 PM      Component Value Range   Magnesium 1.9  1.5 - 2.5 mg/dL  PHOSPHORUS     Status: Normal   Collection Time   05/12/12  5:09 PM      Component Value Range   Phosphorus 2.5  2.3 - 4.6 mg/dL  COMPREHENSIVE METABOLIC PANEL     Status: Abnormal   Collection Time   05/12/12  5:14 PM      Component Value Range   Sodium 139  135 -  145 mEq/L   Potassium 3.7  3.5 - 5.1 mEq/L   Chloride 101  96 - 112 mEq/L   CO2 30  19 - 32 mEq/L   Glucose, Bld 135 (*) 70 - 99 mg/dL   BUN 17  6 - 23 mg/dL   Creatinine, Ser 7.82  0.50 - 1.10 mg/dL   Calcium 9.1  8.4 - 95.6 mg/dL   Total Protein 6.4  6.0 - 8.3 g/dL   Albumin 3.3 (*) 3.5 - 5.2 g/dL   AST 21  0 - 37 U/L   ALT 15  0 - 35 U/L   Alkaline Phosphatase 76  39 - 117 U/L   Total Bilirubin 0.7  0.3 - 1.2 mg/dL   GFR calc non Af Amer 58 (*) >90 mL/min   GFR calc Af Amer 67 (*) >90 mL/min  CARDIAC PANEL(CRET KIN+CKTOT+MB+TROPI)     Status: Abnormal   Collection Time   05/12/12  5:15 PM      Component Value Range   Total CK 53  7 - 177 U/L   CK, MB 0.1 (*) 0.3 - 4.0 ng/mL   Troponin I <0.30  <0.30 ng/mL   Relative Index RELATIVE INDEX IS INVALID  0.0 - 2.5  GLUCOSE, CAPILLARY     Status: Abnormal   Collection Time   05/12/12 10:36 PM      Component Value Range   Glucose-Capillary 215 (*) 70 - 99 mg/dL   Comment 1 Documented in Chart     Comment 2 Notify RN    CARDIAC PANEL(CRET KIN+CKTOT+MB+TROPI)     Status: Abnormal   Collection Time   05/12/12 11:40 PM      Component Value Range   Total CK 49  7 - 177 U/L   CK, MB 0.1 (*) 0.3 - 4.0 ng/mL   Troponin I <0.30  <0.30 ng/mL   Relative Index RELATIVE INDEX IS INVALID  0.0 - 2.5  GLUCOSE, CAPILLARY     Status: Abnormal   Collection Time   05/13/12 12:23 AM      Component Value Range  Glucose-Capillary 189 (*) 70 - 99 mg/dL   Comment 1 Documented in Chart     Comment 2 Notify RN    LIPID PANEL     Status: Normal   Collection Time   05/13/12  7:10 AM      Component Value Range   Cholesterol 155  0 - 200 mg/dL   Triglycerides 95  <161 mg/dL   HDL 64  >09 mg/dL   Total CHOL/HDL Ratio 2.4     VLDL 19  0 - 40 mg/dL   LDL Cholesterol 72  0 - 99 mg/dL  CARDIAC PANEL(CRET KIN+CKTOT+MB+TROPI)     Status: Normal   Collection Time   05/13/12  7:10 AM      Component Value Range   Total CK 66  7 - 177 U/L   CK, MB 0.4  0.3  - 4.0 ng/mL   Troponin I <0.30  <0.30 ng/mL   Relative Index RELATIVE INDEX IS INVALID  0.0 - 2.5  GLUCOSE, CAPILLARY     Status: Abnormal   Collection Time   05/13/12  8:06 AM      Component Value Range   Glucose-Capillary 140 (*) 70 - 99 mg/dL   Comment 1 Notify RN     Comment 2 Documented in Chart     Lipid Panel  Basename 05/13/12 0710  CHOL 155  TRIG 95  HDL 64  CHOLHDL 2.4  VLDL 19  LDLCALC 72    Studies/Results: Dg Chest 2 View  05/12/2012  *RADIOLOGY REPORT*  Clinical Data: Stroke.  Diabetes.  CHEST - 2 VIEW  Comparison: 06/19/2007  Findings: Normal heart size and pulmonary vascularity.  No focal airspace consolidation in the lungs.  No blunting of costophrenic angles.  Scattered fibrosis and calcified granulomas.  No pneumothorax.  Degenerative changes in the spine and shoulders. Calcification of the aorta.  Stable appearance since previous study.  Anterior compression of the upper lumbar vertebra.  This is consistent with L2 compression seen on previous lumbar spine films 05/27/2011.  IMPRESSION: No evidence of active pulmonary disease.  Original Report Authenticated By: Marlon Pel, M.D.   Ct Head Wo Contrast  05/12/2012  *RADIOLOGY REPORT*  Clinical Data: Malignant hypertension and right hand weakness  CT HEAD WITHOUT CONTRAST  Technique:  Contiguous axial images were obtained from the base of the skull through the vertex without contrast.  Comparison: MRA 08/31/2011, head CT 08/11/2005  Findings: There is new focal 4 mm hyperdensity within the left pons, image 8.  Extensive periventricular white matter hypodensity is noted, compatible with small vessel ischemic change.  Mild degree of cortical volume loss with proportional ventricular prominence.  Mild asymmetric prominence of the left lateral ventricle as compared to the right is stable.  Orbits and paranasal sinuses are intact.  No skull fracture.  No mass lesion or hypodense acute infarction is identified. No midline  shift.  IMPRESSION: New focal hyperdensity within the left pons, which may represent acute hemorrhage. Critical Value/emergent results were called by telephone at the time of interpretation on 05/12/2012 at 11:20 p.m. to Maren Reamer and Morrie Sheldon, RN, who verbally acknowledged these results.  Original Report Authenticated By: Harrel Lemon, M.D.    Medications:     Scheduled:   . atenolol  50 mg Oral Daily  . atorvastatin  40 mg Oral Daily  . insulin aspart  0-9 Units Subcutaneous TID WC  . sodium chloride  500 mL Intravenous Once  . sodium chloride  3 mL Intravenous  Q12H  . DISCONTD: aspirin  81 mg Oral Daily  . DISCONTD: atenolol  100 mg Oral Daily  . DISCONTD: enoxaparin (LOVENOX) injection  30 mg Subcutaneous Q24H  . DISCONTD: hydrochlorothiazide  25 mg Oral Daily    Assessment/Plan:   78yo female with 4 week history of right hand weakness without injury. Seen at neurology office for further evaluation of weakness when she was noted to have malignant HTN.  CT head was obtained showing  New focal hyperdensity within the left pons, which may represent acute hemorrhage. Patient at present time is off antiplatelets and BP is controlled in step down unit.    Differential continues to include, cervical radiculopathy, versus small left cortical infarct due to malignant HTN versus neuropathy effecting median/ulnar nerve.   Recommend: 1) Continue BP management keeping SBP <180 and DBP <110, Call primary MD if parameters are exceeded.  2) MRI brain and C-Spine along with MRA head--Pending.  3) 2 D echo and Carotid doppler.  4) Hold ASA until MRI is obtained to confirm if CT findings are consistent with bleed. 5) EMG/NCV  And full cognitive evaluation as out patient.    Felicie Morn PA-C [facility] [phone]  05/13/2012, 10:43 AM

## 2012-05-13 NOTE — Progress Notes (Signed)
*  PRELIMINARY RESULTS* Vascular Ultrasound Carotid Duplex (Doppler) and right subclavian artery duplex has been completed.  Preliminary findings: Bilaterally 40-59% ICA stenosis, highest end of scale.  Bilateral antegrade vertebral flow. Right subclavian artery is within normal limits.  EUNICE, Ridwan Bondy RDMS 05/13/2012, 11:38 AM

## 2012-05-13 NOTE — Progress Notes (Signed)
Overnight event:  Pt here for ? stroke with workup. Had CT head tonight and radiology called this NP stating the CT showed a 4mm small possible hemorrhage in the left pons. This NP called RN and asked about pt's neuro status. RN said no acute changes since shift began. Told RN to call for any neuro status changes. This NP called Dr. Roseanne Reno, neuro. Dr. Roseanne Reno said the CT shows ? bleed and is likely insignificant. He stated it was OK to leave the pt in step down and wait til the a.m. to do the MRI/MRA brain. We have ensured that the pt is on no blood thinning meds.  Dr. Toniann Fail made aware of event/situation/CT.   Maren Reamer, NP [facility]

## 2012-05-13 NOTE — Progress Notes (Signed)
CT from during the night showed a possible 4 mm hemorrhage in the left pons.  Craige Cotta, NP made aware.  Was told to call her back with any neuro changes. No changes were seen during the night; will continue to monitor.    Kizzie Ide

## 2012-05-13 NOTE — Progress Notes (Signed)
[facility] Progress Note [place] TEAM 1 - Stepdown/ICU TEAM   EVENY ANASTAS OZH:[id number] DOB: 1933/01/18 DOA: 05/12/2012 PCP: Joycelyn Rua, MD  Brief narrative:  Assessment/Plan:  Right hand weakness *Initial concern was of possible CVA event or neurological changes mediated by uncontrolled hypertension *Currently on exam no motor deficits - patient has subjective complaints of continued numbness and weakness in the right hand only *CT of the head was performed at admission and showed a tiny 4 mm area that may be a hemorrhage in the left pons. This was reviewed by the neurologist on call who felt this was insignificant but an MRI/MRA of the brain is pending and until this is returned patient will not be started on any antiplatelet or other anticoagulation-type medication *Arterial duplex study of the right upper extremity is pending to rule out subclavian steal syndrome although current pulses are easily palpable and the preliminary report reveals right subclavian artery is within normal limits *Preliminary report on carotid Doppler demonstrated bilateral 40-59% ICA stenosis and bilateral antegrade vertebral flow *On physical exam Tinnel's sign was negative for obvious signs of carpal  HTN (hypertension), malignant *Patient's BP is very labile and has significant swings between very high and very low since admission *At home she was on Tenormin 100 mg daily and since admission has been started on hydrochlorothiazide and has when necessary Apresoline ordered *After receiving hydralazine this morning patient's blood pressure dropped medically and when orthostatics were checked these were positive *Currently we have discontinued the hydrochlorothiazide   Dehydration/Orthostasis *Patient's hemoglobin at admission was 15 and appeared consistent with hemoconcentration and since admission and after administration of hydrochlorothiazide has risen to 16.5 with hematocrit of 48.3 *A  2-D echocardiogram was ordered at admission and is essentially normal except for mild grade 1 diastolic dysfunction. In addition her IVC was noted to be small and collapse spontaneously suggesting volume depletion  *We will go ahead and give a 500 cc normal saline bolus and begin IV fluids  *As precaution we'll check a random cortisol  Diabetes mellitus *CBGs have been variable since admission *Patient was diet controlled at home *Continue sliding scale insulin  Hypercholesterolemia *Continue Lipitor  Code Status: Full Family Communication: Patient alert and oriented - current diagnosis and plans for care discussed with the patient by Dr. Sharon Seller Disposition Plan: Remain in step down  Consultants: Neurology/Dr. Otelia Limes  Procedures: None  Antibiotics: None  HPI/Subjective: Pt is awake and alert.  She is conversant.  She c/o tingling in her R hand, but o/w denies neurologic deficits.  She denies cp, sob, f/c, n/v, or ha.   Objective: Blood pressure 139/53, pulse 70, temperature 98.1 F (36.7 C), temperature source Oral, resp. rate 15, height 5' 4.5" (1.638 m), weight 75.8 kg (167 lb 1.7 oz), SpO2 96.00%.  Intake/Output Summary (Last 24 hours) at 05/13/12 1416 Last data filed at 05/13/12 0900  Gross per 24 hour  Intake      0 ml  Output   1295 ml  Net  -1295 ml     Exam: General: No acute respiratory distress Lungs: Clear to auscultation bilaterally without wheezes or crackles Cardiovascular: Regular rate and rhythm without murmur gallop or rub normal S1 and S2, orthostatic vital signs were positive for orthostasis, no edema or cyanosis Abdomen: Nontender, nondistended, soft, bowel sounds positive, no rebound, no ascites, no appreciable mass Musculoskeletal: No significant cyanosis, clubbing Neurological: Patient alert oriented x3, moving all extremities x4, strength and grips are equal in upper and lower extremities  and sensation is intact  Data Reviewed: Basic  Metabolic Panel:  Lab 05/13/12 9604 05/12/12 1714 05/12/12 1709  NA 137 139 --  K 4.0 3.7 --  CL 102 101 --  CO2 23 30 --  GLUCOSE 159* 135* --  BUN 13 17 --  CREATININE 0.83 0.93 --  CALCIUM 9.5 9.1 --  MG -- -- 1.9  PHOS -- -- 2.5   Liver Function Tests:  Lab 05/12/12 1714  AST 21  ALT 15  ALKPHOS 76  BILITOT 0.7  PROT 6.4  ALBUMIN 3.3*   CBC:  Lab 05/13/12 1014 05/12/12 1709  WBC 9.3 8.9  NEUTROABS -- --  HGB 16.5* 15.0  HCT 48.3* 44.0  MCV 89.9 89.2  PLT 144* 152   Cardiac Enzymes:  Lab 05/13/12 0710 05/12/12 2340 05/12/12 1715  CKTOTAL 66 49 53  CKMB 0.4 0.1* 0.1*  CKMBINDEX -- -- --  TROPONINI <0.30 <0.30 <0.30   CBG:  Lab 05/13/12 1200 05/13/12 0806 05/13/12 0023 05/12/12 2236 05/12/12 1644  GLUCAP 128* 140* 189* 215* 160*    Recent Results (from the past 240 hour(s))  MRSA PCR SCREENING     Status: Normal   Collection Time   05/12/12  2:52 PM      Component Value Range Status Comment   MRSA by PCR NEGATIVE  NEGATIVE Final      Studies:  Recent x-ray studies have been reviewed in detail by the Attending Physician  Scheduled Meds:  Reviewed in detail by the Attending Physician   Junious Silk, ANP [facility] Office  [phone] Pager [phone]  On-Call/Text Page:      [email or web address]      password [email or web address]  If 7PM-7AM, please contact night-coverage [email or web address] Password [email or web address] 05/13/2012, 2:16 PM   LOS: 1 day   I have personally examined this patient and reviewed the entire database. I have reviewed the above note, made any necessary editorial changes, and agree with its content.  Lonia Blood, MD [facility]

## 2012-05-13 NOTE — Evaluation (Signed)
Physical Therapy Evaluation Patient Details Name: Theresa Sexton MRN: [id number] DOB: 1932/12/18 Today's Date: 05/13/2012 Time: 4098-1191 PT Time Calculation (min): 33 min  PT Assessment / Plan / Recommendation Clinical Impression  Pt's UE strength appears WFL with no s/s of inccordination or decr function.  LE's show some mild proximal weakness, but symmetrical in nature.  Pt with some gait instability that would not be safe at home alone.  ? if this was totally due to her dizziness.  Pt would benefit from HHPT and S with mobility.  Daughter works and may not be able to assist if pt is not I..    PT Assessment  Patient needs continued PT services    Follow Up Recommendations  Home health PT;Other (comment) (vs rehab if pt does not become Independent by D/C)    Barriers to Discharge Decreased caregiver support      Equipment Recommendations  None recommended by SLP    Recommendations for Other Services     Frequency Min 3X/week    Precautions / Restrictions Precautions Precautions: Fall   Pertinent Vitals/Pain       Mobility  Bed Mobility Bed Mobility: Not assessed Transfers Transfers: Sit to Stand;Stand to Sit;Stand Pivot Transfers Sit to Stand: 4: Min assist;4: Min guard;With upper extremity assist;From bed;From chair/3-in-1 Stand to Sit: 4: Min guard;With upper extremity assist;To bed;To chair/3-in-1 Stand Pivot Transfers: 4: Min assist Details for Transfer Assistance: pt more unsteady when getting dizzy (since being in the hospital)  vc's for safety; stability assist Ambulation/Gait Ambulation/Gait Assistance: 4: Min guard;4: Min assist Ambulation Distance (Feet): 60 Feet Assistive device: 1 person hand held assist;None Ambulation/Gait Assistance Details: initially pt was dizzy with noticeably diminished equilibrium and need for min assist on the second trial, she was not dizzy and  was less unsteady.  As she progressed she was able to attain min guard level on even  surfaces;  vc's for savety issues; stability assist initially Gait Pattern: Step-through pattern;Decreased step length - right;Decreased step length - left;Decreased stride length;Shuffle;Scissoring;Wide base of support Stairs: No Wheelchair Mobility Wheelchair Mobility: No    Exercises     PT Diagnosis: Difficulty walking;Generalized weakness  PT Problem List: Decreased strength;Decreased activity tolerance;Decreased balance;Decreased mobility;Decreased safety awareness PT Treatment Interventions: Gait training;Stair training;Functional mobility training;Therapeutic activities;Balance training;Patient/family education   PT Goals Acute Rehab PT Goals PT Goal Formulation: With patient Time For Goal Achievement: 05/13/12 Potential to Achieve Goals: Good Pt will go Supine/Side to Sit: Independently PT Goal: Supine/Side to Sit - Progress: Goal set today Pt will go Sit to Stand: Independently PT Goal: Sit to Stand - Progress: Goal set today Pt will Transfer Bed to Chair/Chair to Bed: Independently PT Transfer Goal: Bed to Chair/Chair to Bed - Progress: Goal set today Pt will Ambulate: 51 - 150 feet;with modified independence;with least restrictive assistive device PT Goal: Ambulate - Progress: Goal set today Pt will Go Up / Down Stairs: 3-5 stairs;with modified independence;with rail(s) PT Goal: Up/Down Stairs - Progress: Goal set today  Visit Information  Last PT Received On: 05/13/12 Assistance Needed: +1    Subjective Data  Subjective: Really it's just my hand Patient Stated Goal: Back home I   Prior Functioning  Home Living Lives With: Daughter Type of Home: House Home Access: Stairs to enter Secretary/administrator of Steps: 5 Entrance Stairs-Rails: Left;Right Home Layout: One level Bathroom Shower/Tub: Engineer, manufacturing systems: Standard Home Adaptive Equipment: None Prior Function Level of Independence: Independent Able to Take Stairs?: Yes Driving:  Yes Communication Communication: No difficulties    Cognition  Overall Cognitive Status: Appears within functional limits for tasks assessed/performed Arousal/Alertness: Awake/alert Orientation Level: Appears intact for tasks assessed Behavior During Session: [facility] for tasks performed    Extremity/Trunk Assessment Right Upper Extremity Assessment RUE ROM/Strength/Tone: Within functional levels RUE Sensation: WFL - Light Touch RUE Coordination: WFL - gross/fine motor Left Upper Extremity Assessment LUE ROM/Strength/Tone: Within functional levels LUE Sensation: WFL - Light Touch LUE Coordination: WFL - gross/fine motor Right Lower Extremity Assessment RLE ROM/Strength/Tone: Within functional levels RLE Sensation: WFL - Light Touch RLE Coordination: WFL - gross/fine motor Left Lower Extremity Assessment LLE ROM/Strength/Tone: Within functional levels (Bil mild weakness in proximal musculature) LLE Sensation: WFL - Light Touch LLE Coordination: WFL - gross/fine motor   Balance Balance Balance Assessed: Yes Static Sitting Balance Static Sitting - Balance Support: No upper extremity supported;Feet supported Static Sitting - Level of Assistance: 5: Stand by assistance  End of Session PT - End of Session Equipment Utilized During Treatment: Gait belt Activity Tolerance: Patient tolerated treatment well Patient left: in chair;with call bell/phone within reach Nurse Communication: Mobility status  GP     Yusra Ravert, Eliseo Gum 05/13/2012, 1:11 PM  05/13/2012  Harahan Bing, PT [phone] (pager)

## 2012-05-13 NOTE — Progress Notes (Addendum)
Had just admin hydralazine at 0806; for BP 210/86, will wait to do ortho BP. Will continue to monitor.  POST hydral ortho, taken: Lying 93/34 Sitting 109/72 Standing 83/35, reported feeling dizzy  (Pt used BSC this am and did not report dizziness, but that was before med) Started bolus per DR order, BP now 139/53 (ca. 0920) MRI requested ativan, admin per Dr order. Pt became nauseous, returned to unit.

## 2012-05-14 LAB — CBC
Hemoglobin: 15 g/dL (ref 12.0–15.0)
MCH: 30.6 pg (ref 26.0–34.0)
MCHC: 34.4 g/dL (ref 30.0–36.0)
RDW: 14.3 % (ref 11.5–15.5)

## 2012-05-14 LAB — GLUCOSE, CAPILLARY

## 2012-05-14 NOTE — Progress Notes (Signed)
Pt to be TX to 4N 05C, VSS, called report.

## 2012-05-14 NOTE — Progress Notes (Addendum)
[facility] Progress Note [place] TEAM 1 - Stepdown/ICU TEAM   NERY KALISZ YNW:[id number] DOB: [birth year] DOA: 05/12/2012 PCP: Joycelyn Rua, MD  Brief narrative: 78 year old with PMH significant for HTN, DM, hypercholesterolemia, who was refer to Dr Everlene Balls for stroke work up. Patient has has right hand weakness, dropping objects, and right arm numbness since 4 weeks. She was at Dr Everlene Balls office today and was found to have blood pressure at 240/110. She was refer for admission for malignant HTN and work up for stroke. Patient confirm weakness or right hand and numbness. She denies slurred speech, dysphagia, lower extremities weakness, vision changes, chest pain, dyspnea. She denies headache, neck pain.   Assessment/Plan:  Right hand weakness/focal hemorrhagic brain infarct x2 *Initial concern was of possible CVA event or neurological changes mediated by uncontrolled hypertension *Currently on exam no motor deficits - patient has subjective complaints of continued numbness and weakness in the right hand only *CT of the head was performed at admission and showed a tiny 4 mm area that may be a hemorrhage in the left pons. This was reviewed by the neurologist on call who felt this was insignificant  *MRI of brain demonstrated 2 focal hemorrhagic infarcts one in the posterior left frontal lobe and one in the central pons with surrounding edema *Arterial duplex study of the right upper extremity is pending to rule out subclavian steal syndrome although current pulses are easily palpable and the preliminary report reveals right subclavian artery is within normal limits *Preliminary report on carotid Doppler demonstrated bilateral 40-59% ICA stenosis and bilateral antegrade vertebral flow *On physical exam Tinnel's sign was negative for obvious signs of carpal  HTN (hypertension), malignant *Patient's BP is very labile and has significant swings between very high and very low since  admission *At home she was on Tenormin 100 mg daily and since admission has been started on hydrochlorothiazide and has when necessary Apresoline ordered *After receiving hydralazine on 05/13/2012 the patient's blood pressure dropped medically and when orthostatics were checked these were positive D/c hydrochlorothiazide - Apresoline was discontinued and the metoprolol dose was decreased from 100 mg twice a day to 50 mg twice a day *Over the next few days suggest slowly uptitrating patient's home metoprolol  Dehydration/Orthostasis *Patient's hemoglobin at admission was 15 and appeared consistent with hemoconcentration and since admission and after administration of hydrochlorothiazide has risen to 16.5 with hematocrit of 48.3 *A 2-D echocardiogram was ordered at admission and is essentially normal except for mild grade 1 diastolic dysfunction. In addition her IVC was noted to be small and collapse spontaneously suggesting volume depletion  *Was given a 500 cc normal saline bolus and IV fluids initiated on 05/13/2012-today we will decrease IV fluids to keep open rate. *Random cortisol normal at 16.5  Grade 1 diastolic dysfunction *Finding on echocardiogram this admission and currently no signs of acute heart failure decompensation  Diabetes mellitus?? *Patient was diet controlled at home and hemoglobin A1c 6.5 *CBGs have been well controlled so we'll discontinue CBG checks and sliding scale insulin  Hypercholesterolemia *Continue Lipitor  Code Status: Full Family Communication: Patient's son was in room and with patient's permission he was updated on the patient's status on 05/14/2012. Disposition Plan: Transfer to neurology floor  Consultants: Neurology/Dr. Otelia Limes  Procedures: None  Antibiotics: None  HPI/Subjective: Pt is awake and alert.  She is conversant.  She c/o tingling in her R hand, but o/w denies neurologic deficits.  She denies cp, sob, f/c, n/v, or  ha.   Objective: Blood pressure 139/86, pulse 60, temperature 97.6 F (36.4 C), temperature source Oral, resp. rate 17, height 5' 4.5" (1.638 m), weight 75.8 kg (167 lb 1.7 oz), SpO2 96.00%.  Intake/Output Summary (Last 24 hours) at 05/14/12 1547 Last data filed at 05/14/12 1300  Gross per 24 hour  Intake    690 ml  Output    625 ml  Net     65 ml     Exam: General: No acute respiratory distress Lungs: Clear to auscultation bilaterally without wheezes or crackles Cardiovascular: Regular rate and rhythm without gallop or rub, has normal S1 and S2 with a soft grade 1/6 systolic murmur consistent with aortic etiology, orthostatic vital signs were positive for orthostasis, no edema or cyanosis Abdomen: Nontender, nondistended, soft, bowel sounds positive, no rebound, no ascites, no appreciable mass Musculoskeletal: No significant cyanosis, clubbing Neurological: Patient alert oriented x3, moving all extremities x4, strength and grips are equal in upper and lower extremities and sensation is intact  Data Reviewed: Basic Metabolic Panel:  Lab 05/13/12 4098 05/12/12 1714 05/12/12 1709  NA 137 139 --  K 4.0 3.7 --  CL 102 101 --  CO2 23 30 --  GLUCOSE 159* 135* --  BUN 13 17 --  CREATININE 0.83 0.93 --  CALCIUM 9.5 9.1 --  MG -- -- 1.9  PHOS -- -- 2.5   Liver Function Tests:  Lab 05/12/12 1714  AST 21  ALT 15  ALKPHOS 76  BILITOT 0.7  PROT 6.4  ALBUMIN 3.3*   CBC:  Lab 05/14/12 0400 05/13/12 1014 05/12/12 1709  WBC 9.0 9.3 8.9  NEUTROABS -- -- --  HGB 15.0 16.5* 15.0  HCT 43.6 48.3* 44.0  MCV 89.0 89.9 89.2  PLT 138* 144* 152   Cardiac Enzymes:  Lab 05/13/12 0710 05/12/12 2340 05/12/12 1715  CKTOTAL 66 49 53  CKMB 0.4 0.1* 0.1*  CKMBINDEX -- -- --  TROPONINI <0.30 <0.30 <0.30   CBG:  Lab 05/14/12 0748 05/13/12 2128 05/13/12 1617 05/13/12 1200 05/13/12 0806  GLUCAP 125* 123* 118* 128* 140*    Recent Results (from the past 240 hour(s))  MRSA PCR  SCREENING     Status: Normal   Collection Time   05/12/12  2:52 PM      Component Value Range Status Comment   MRSA by PCR NEGATIVE  NEGATIVE Final      Studies:  Recent x-ray studies have been reviewed in detail by the Attending Physician  Scheduled Meds:  Reviewed in detail by the Attending Physician   Junious Silk, ANP [facility] Office  [phone] Pager [phone]  On-Call/Text Page:      [email or web address]      password [email or web address]  If 7PM-7AM, please contact night-coverage [email or web address] Password [facility] 05/14/2012, 3:47 PM   LOS: 2 days      I have examined the patient and reviewed the chart. I have modified the above note and agree with it.   Calvert Cantor, MD [phone]

## 2012-05-14 NOTE — Progress Notes (Signed)
Stroke Team Progress Note  HISTORY Theresa Sexton is an 78 y.o. female who was seen by Dr. Joycelyn Rua, primary MD, and then referred to Dr. Marjory Lies for workup of stroke-type symptoms. Over the past 4 weeks the patient has noticed right hand weakness where she uncontrollably drops things. She denies any other extremity weakness, speech or thought process problems. No shaking. Family in room and confirms story. She denies any history of stroke but has significant risk factors.  She saw her neurologist as an outpatient today with bp readings of 240/110 and was admitted to the step-down unit for close monitoring. Last bp reading was 195/71 at 2pm.  Patient was not a TPA candidate secondary to hemorrhage. She was admitted to the step down ICU for further evaluation and treatment.  SUBJECTIVE Her son and daughter are at the bedside.  Overall she feels her condition is gradually improving.   OBJECTIVE Most recent Vital Signs: Filed Vitals:   05/14/12 0343 05/14/12 0456 05/14/12 0640 05/14/12 0700  BP:  163/66 168/61   Pulse: 58  55   Temp:  97.5 F (36.4 C)  97.8 F (36.6 C)  TempSrc:  Oral  Oral  Resp: 16  17   Height:      Weight:      SpO2: 95% 98% 97%    CBG (last 3)  Basename 05/13/12 2128 05/13/12 1617 05/13/12 1200  GLUCAP 123* 118* 128*   Intake/Output from previous day: 07/17 0701 - 07/18 0700 In: 450 [I.V.:450] Out: 700 [Urine:700]  IV Fluid Intake:     . sodium chloride 75 mL/hr at 05/13/12 2310   MEDICATIONS    . atorvastatin  40 mg Oral Daily  . insulin aspart  0-9 Units Subcutaneous TID WC  . LORazepam      . metoprolol tartrate  50 mg Oral BID  . sodium chloride  500 mL Intravenous Once  . DISCONTD: atenolol  100 mg Oral Daily  . DISCONTD: atenolol  50 mg Oral Daily  . DISCONTD: hydrochlorothiazide  25 mg Oral Daily  . DISCONTD: sodium chloride  3 mL Intravenous Q12H   PRN:  hydrALAZINE, LORazepam, senna-docusate, DISCONTD: sodium chloride, DISCONTD:  hydrALAZINE, DISCONTD: sodium chloride  Diet:  Carb Control thin liquids Activity:  OOB with assistance DVT Prophylaxis:  SCDs   CLINICALLY SIGNIFICANT STUDIES Basic Metabolic Panel:  Lab 05/13/12 1610 05/12/12 1714 05/12/12 1709  NA 137 139 --  K 4.0 3.7 --  CL 102 101 --  CO2 23 30 --  GLUCOSE 159* 135* --  BUN 13 17 --  CREATININE 0.83 0.93 --  CALCIUM 9.5 9.1 --  MG -- -- 1.9  PHOS -- -- 2.5   Liver Function Tests:  Lab 05/12/12 1714  AST 21  ALT 15  ALKPHOS 76  BILITOT 0.7  PROT 6.4  ALBUMIN 3.3*   CBC:  Lab 05/14/12 0400 05/13/12 1014  WBC 9.0 9.3  NEUTROABS -- --  HGB 15.0 16.5*  HCT 43.6 48.3*  MCV 89.0 89.9  PLT 138* 144*   Coagulation: No results found for this basename: LABPROT:4,INR:4 in the last 168 hours Cardiac Enzymes:  Lab 05/13/12 0710 05/12/12 2340 05/12/12 1715  CKTOTAL 66 49 53  CKMB 0.4 0.1* 0.1*  CKMBINDEX -- -- --  TROPONINI <0.30 <0.30 <0.30   Urinalysis: No results found for this basename: COLORURINE:2,APPERANCEUR:2,LABSPEC:2,PHURINE:2,GLUCOSEU:2,HGBUR:2,BILIRUBINUR:2,KETONESUR:2,PROTEINUR:2,UROBILINOGEN:2,NITRITE:2,LEUKOCYTESUR:2 in the last 168 hours Lipid Panel    Component Value Date/Time   CHOL 155 05/13/2012 0710   TRIG 95 05/13/2012  0710   HDL 64 05/13/2012 0710   CHOLHDL 2.4 05/13/2012 0710   VLDL 19 05/13/2012 0710   LDLCALC 72 05/13/2012 0710   HgbA1C  Lab Results  Component Value Date   HGBA1C 6.5* 05/13/2012    Urine Drug Screen:   No results found for this basename: labopia, cocainscrnur, labbenz, amphetmu, thcu, labbarb    Alcohol Level: No results found for this basename: ETH:2 in the last 168 hours  Mr Cervical Spine Wo Contrast 05/14/2012 Bone marrow lesions at C7-T1, 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  Negative for fracture.  Cervical spondylosis of a moderate degree.  This is most severe at C4-5 where there is moderate spinal stenosis.  Spondylosis is primarily on the left side and there is significant left foraminal  encroachment at C4-5, C5-6, and C6-7.   CT of the brain  05/12/2012  New focal hyperdensity within the left pons, which may represent acute hemorrhage.   MRI of the brain  05/13/2012   1.  Focal hemorrhagic infarct of the posterior left frontal lobe. The hemorrhage is new since the prior study. 2.  Focal hemorrhagic infarct within the central pons with surrounding edema. 3.  Otherwise stable atrophy and extensive white matter disease, likely reflecting the sequelae of chronic microvascular ischemia. 4.  Minimal maxillary sinus mucosal thickening. 5.  Small right mastoid effusion without an obstructing nasopharyngeal lesion.    MRA of the brain  05/13/2012  1.  Medium and small vessel disease is exaggerated by patient motion. 2.  Significant signal loss in the vertebral arteries as well as the pre cavernous cavernous carotid arteries is at least in part artifactual. 3.  Signal loss in the proximal M2 segments bilaterally is artifactual.    2D Echocardiogram  EF 60-65% with no source of embolus. thickness of the septum, consistent with lipomatous hypertrophy  Carotid Doppler  Bilaterally 40-59% ICA stenosis, highest end of scale. Bilateral antegrade vertebral flow. Right subclavian artery is within normal limits.  CXR  05/12/2012  No evidence of active pulmonary disease.    EKG  normal sinus rhythm, nonspecific ST and T waves changes.   Therapy Recommendations PT - home health, OT -none  Physical Exam  Pleasant elderly caucasian lady not in distress.Awake alert. Afebrile. Head is nontraumatic. Neck is supple without bruit. Hearing is normal. Cardiac exam no murmur or gallop. Lungs are clear to auscultation. Distal pulses are well felt.  Neurological Exam : Awake  Alert oriented x 3. Normal speech and language.eye movements full without nystagmus.Visual fields and acuity are normal. Fundi not visualized.. Face symmetric. Tongue midline. Normal strength, tone, reflexes and coordination except mild right  grip and intrinsic hand weakness. Orbits left over right upper extremity.. Normal sensation. Gait deferred.  ASSESSMENT Theresa Sexton is a 78 y.o. female with a left posterior frontal lob and central pontine hemorrhage secondary to malignant hypertension.  On aspirin 81 mg orally every day prior to admission. Now on no antiplatlets for secondary stroke prevention now given hemorrhage. Patient with resultant right hand weakness.   -malignant hypertension, BP 240/110 on admission, off drips currently -right hand weakness, differential includes carpal tunnel, right C8 or T1 radiculopathy, brachial plexus lesion. -diabetes -hyperlipidemia  Hospital day # 2  TREATMENT/PLAN -agree with plans to transfer to the floor -HHPT -watch BP -anticipate discharge soon -D/w patient, family and hospitalist Joaquin Music, ANP-BC, GNP-BC [facility] Pager: [phone] 05/14/2012 8:47 AM  Dr. Delia Heady, Stroke Center Medical Director, has personally reviewed chart,  pertinent data, examined the patient and developed the plan of care. Pager:  [phone]

## 2012-05-14 NOTE — Progress Notes (Signed)
Physical Therapy Treatment Patient Details Name: Theresa Sexton MRN: [id number] DOB: 05/01/1933 Today's Date: 05/14/2012 Time: 5621-3086 PT Time Calculation (min): 21 min  PT Assessment / Plan / Recommendation Comments on Treatment Session  pt would benefit from more frequent S or check in from family, but generally is steady in a homelike environment    Follow Up Recommendations  Home health PT;Supervision for mobility/OOB    Barriers to Discharge        Equipment Recommendations  None recommended by PT    Recommendations for Other Services    Frequency     Plan Discharge plan remains appropriate    Precautions / Restrictions Precautions Precautions: Fall   Pertinent Vitals/Pain     Mobility  Bed Mobility Bed Mobility: Supine to Sit;Sitting - Scoot to Edge of Bed Supine to Sit: 7: Independent Sitting - Scoot to Edge of Bed: 7: Independent Transfers Transfers: Sit to Stand;Stand to Sit Sit to Stand: 5: Supervision;With upper extremity assist;From bed Stand to Sit: 5: Supervision;With upper extremity assist;To bed Details for Transfer Assistance: reinforcing safety issues Ambulation/Gait Ambulation/Gait Assistance: 4: Min guard Ambulation Distance (Feet): 500 Feet Assistive device: None Ambulation/Gait Assistance Details: mildly unsteady during warm up due to had not been up all day, then a shuffled gait with fatigue, but generally steady throughout. Gait Pattern: Step-through pattern;Decreased step length - right;Decreased step length - left;Decreased stride length;Shuffle;Scissoring;Wide base of support Stairs: Yes Stair Management Technique: One rail Right;Alternating pattern;Forwards;Other (comment) (guarded) Number of Stairs: 4  Wheelchair Mobility Wheelchair Mobility: No    Exercises     PT Diagnosis:    PT Problem List:   PT Treatment Interventions:     PT Goals Acute Rehab PT Goals Time For Goal Achievement: 05/13/12 Potential to Achieve Goals:  Good PT Goal: Supine/Side to Sit - Progress: Met PT Goal: Sit to Stand - Progress: Progressing toward goal PT Transfer Goal: Bed to Chair/Chair to Bed - Progress: Progressing toward goal PT Goal: Ambulate - Progress: Progressing toward goal PT Goal: Up/Down Stairs - Progress: Progressing toward goal  Visit Information  Last PT Received On: 05/14/12 Assistance Needed: +1    Subjective Data  Subjective: my R hand is still a little numb   Cognition  Overall Cognitive Status: Appears within functional limits for tasks assessed/performed Arousal/Alertness: Awake/alert Orientation Level: Appears intact for tasks assessed Behavior During Session: [facility] for tasks performed    Balance     End of Session PT - End of Session Activity Tolerance: Patient tolerated treatment well Patient left: with call bell/phone within reach;in bed Nurse Communication: Mobility status   GP     Teea Ducey, Eliseo Gum 05/14/2012, 5:01 PM  05/14/2012  Selma Bing, PT [phone] (pager)

## 2012-05-15 DIAGNOSIS — I619 Nontraumatic intracerebral hemorrhage, unspecified: Secondary | ICD-10-CM | POA: Diagnosis present

## 2012-05-15 DIAGNOSIS — I10 Essential (primary) hypertension: Secondary | ICD-10-CM

## 2012-05-15 MED ORDER — POLYETHYLENE GLYCOL 3350 17 G PO PACK
17.0000 g | PACK | Freq: Every day | ORAL | Status: DC
  Filled 2012-05-15: qty 1

## 2012-05-15 MED ORDER — HYDRALAZINE HCL 25 MG PO TABS
25.0000 mg | ORAL_TABLET | Freq: Three times a day (TID) | ORAL | Status: DC
  Filled 2012-05-15 (×4): qty 1

## 2012-05-15 MED ORDER — AMLODIPINE BESYLATE 10 MG PO TABS
10.0000 mg | ORAL_TABLET | Freq: Every day | ORAL | Status: DC
  Administered 2012-05-15: 10 mg via ORAL
  Filled 2012-05-15: qty 1

## 2012-05-15 MED ORDER — AMLODIPINE BESYLATE 10 MG PO TABS: 10.0000 mg | ORAL_TABLET | Freq: Every day | ORAL | Status: DC

## 2012-05-15 NOTE — Care Management Note (Signed)
    Page 1 of 1   05/18/2012     8:24:40 AM   CARE MANAGEMENT NOTE 05/18/2012  Patient:  Theresa Sexton, Theresa Sexton   Account Number:  [id number]  Date Initiated:  05/15/2012  Documentation initiated by:  [employer]  Subjective/Objective Assessment:   Admitted with CVA. Lives with daughter, independent with ambulation prior to admission.     Action/Plan:   PT eval-recommending HHPT  OT eval- no d/c needs   Anticipated DC Date:  05/15/2012   Anticipated DC Plan:  HOME W HOME HEALTH SERVICES         Choice offered to / List presented to:  C-1 Patient        HH arranged  HH-2 PT      HH agency  [facility]   Status of service:  Completed, signed off Medicare Important Message given?   (If response is "NO", the following Medicare IM given date fields will be blank) Date Medicare IM given:   Date Additional Medicare IM given:    Discharge Disposition:  HOME W HOME HEALTH SERVICES  Per UR Regulation:  Reviewed for med. necessity/level of care/duration of stay  If discussed at Long Length of Stay Meetings, dates discussed:    Comments:  05/15/12 HHPT ordered. Spoke with patient about HHPTC. She chose [facility] from the [facility] agencies list. Dava Najjar at [facility] and requested HHPT anticipatinf d/c today. No equipment needs identified by PT or OT. Will continue to follow for d/c needs. Jacquelynn Cree RN, BSN, CCM

## 2012-05-15 NOTE — Progress Notes (Signed)
Stroke Team Progress Note  HISTORY Theresa Sexton is an 78 y.o. female who was seen by Dr. Joycelyn Rua, primary MD, and then referred to Dr. Marjory Lies for workup of stroke-type symptoms. Over the past 4 weeks the patient has noticed right hand weakness where she uncontrollably drops things. She denies any other extremity weakness, speech or thought process problems. No shaking. Family in room and confirms story. She denies any history of stroke but has significant risk factors.  She saw her neurologist as an outpatient today with bp readings of 240/110 and was admitted to the step-down unit for close monitoring. Last bp reading was 195/71 at 2pm.  Patient was not a TPA candidate secondary to hemorrhage. She was admitted to the step down ICU for further evaluation and treatment.  SUBJECTIVE Patient up at bedside, eating lunch. No complaints.  OBJECTIVE Most recent Vital Signs: Filed Vitals:   05/15/12 0157 05/15/12 0553 05/15/12 0855 05/15/12 1000  BP: 182/81 202/86 141/64 143/72  Pulse: 64 66 73 75  Temp: 98 F (36.7 C) 98 F (36.7 C)  97.5 F (36.4 C)  TempSrc: Oral Oral  Oral  Resp: 20 20  18   Height:      Weight:      SpO2: 94% 93%  97%   CBG (last 3)   Basename 05/14/12 0748 05/13/12 2128 05/13/12 1617  GLUCAP 125* 123* 118*   Intake/Output from previous day: 07/18 0701 - 07/19 0700 In: 240 [P.O.:240] Out: 125 [Urine:125]  IV Fluid Intake:     . sodium chloride 10 mL/hr (05/14/12 1020)   MEDICATIONS    . amLODipine  10 mg Oral Daily  . atorvastatin  40 mg Oral Daily  . metoprolol tartrate  50 mg Oral BID  . polyethylene glycol  17 g Oral Daily  . DISCONTD: hydrALAZINE  25 mg Oral Q8H   PRN:  hydrALAZINE, senna-docusate  Diet:  Carb Control thin liquids Activity:  OOB with assistance DVT Prophylaxis:  SCDs   CLINICALLY SIGNIFICANT STUDIES Basic Metabolic Panel:  Lab 05/13/12 1610 05/12/12 1714 05/12/12 1709  NA 137 139 --  K 4.0 3.7 --  CL 102 101 --    CO2 23 30 --  GLUCOSE 159* 135* --  BUN 13 17 --  CREATININE 0.83 0.93 --  CALCIUM 9.5 9.1 --  MG -- -- 1.9  PHOS -- -- 2.5   Liver Function Tests:  Lab 05/12/12 1714  AST 21  ALT 15  ALKPHOS 76  BILITOT 0.7  PROT 6.4  ALBUMIN 3.3*   CBC:  Lab 05/14/12 0400 05/13/12 1014  WBC 9.0 9.3  NEUTROABS -- --  HGB 15.0 16.5*  HCT 43.6 48.3*  MCV 89.0 89.9  PLT 138* 144*   Cardiac Enzymes:   Lab 05/13/12 0710 05/12/12 2340 05/12/12 1715  CKTOTAL 66 49 53  CKMB 0.4 0.1* 0.1*  CKMBINDEX -- -- --  TROPONINI <0.30 <0.30 <0.30   Lipid Panel    Component Value Date/Time   CHOL 155 05/13/2012 0710   TRIG 95 05/13/2012 0710   HDL 64 05/13/2012 0710   CHOLHDL 2.4 05/13/2012 0710   VLDL 19 05/13/2012 0710   LDLCALC 72 05/13/2012 0710   HgbA1C  Lab Results  Component Value Date   HGBA1C 6.5* 05/13/2012   Urine Drug Screen:   No results found for this basename: labopia,  cocainscrnur,  labbenz,  amphetmu,  thcu,  labbarb    Alcohol Level: No results found for this basename: ETH:2 in  the last 168 hours  Mr Cervical Spine Wo Contrast 05/14/2012 Bone marrow lesions at C7-T1, 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  Negative for fracture.  Cervical spondylosis of a moderate degree.  This is most severe at C4-5 where there is moderate spinal stenosis.  Spondylosis is primarily on the left side and there is significant left foraminal encroachment at C4-5, C5-6, and C6-7.   CT of the brain  05/12/2012  New focal hyperdensity within the left pons, which may represent acute hemorrhage.   MRI of the brain  05/13/2012   1.  Focal hemorrhagic infarct of the posterior left frontal lobe. The hemorrhage is new since the prior study. 2.  Focal hemorrhagic infarct within the central pons with surrounding edema. 3.  Otherwise stable atrophy and extensive white matter disease, likely reflecting the sequelae of chronic microvascular ischemia. 4.  Minimal maxillary sinus mucosal thickening. 5.  Small right mastoid effusion  without an obstructing nasopharyngeal lesion.    MRA of the brain  05/13/2012  1.  Medium and small vessel disease is exaggerated by patient motion. 2.  Significant signal loss in the vertebral arteries as well as the pre cavernous cavernous carotid arteries is at least in part artifactual. 3.  Signal loss in the proximal M2 segments bilaterally is artifactual.    2D Echocardiogram  EF 60-65% with no source of embolus. thickness of the septum, consistent with lipomatous hypertrophy  Carotid Doppler  Bilaterally 40-59% ICA stenosis, highest end of scale. Bilateral antegrade vertebral flow. Right subclavian artery is within normal limits.  CXR  05/12/2012  No evidence of active pulmonary disease.    EKG  normal sinus rhythm, nonspecific ST and T waves changes.   Therapy Recommendations PT - home health, OT -none  Physical Exam  Pleasant elderly caucasian lady not in distress.Awake alert. Afebrile. Head is nontraumatic. Neck is supple without bruit. Hearing is normal. Cardiac exam no murmur or gallop. Lungs are clear to auscultation. Distal pulses are well felt.  Neurological Exam : Awake  Alert oriented x 3. Normal speech and language.eye movements full without nystagmus.Visual fields and acuity are normal. Fundi not visualized.. Face symmetric. Tongue midline. Normal strength, tone, reflexes and coordination except mild right grip and intrinsic hand weakness. Orbits left over right upper extremity.. Normal sensation. Gait deferred.   ASSESSMENT Ms. Theresa Sexton is a 78 y.o. 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  On aspirin 81 mg orally every day prior to admission. Now on no antiplatlets for secondary stroke prevention now given hemorrhage. Patient with resultant right hand weakness. OP PT recommended.  -malignant hypertension, BP 240/110 on admission, off drips currently -right hand weakness, differential includes carpal  tunnel, right C8 or T1 radiculopathy, brachial plexus lesion. -diabetes -hyperlipidemia  Hospital day # 3  TREATMENT/PLAN -HHPT -ok for discharge from neuro standpoint Stroke Service will sign off. Follow up with Dr. Pearlean Brownie in 2 months.  Joaquin Music, ANP-BC, GNP-BC [facility] Pager: [phone] 05/15/2012 3:02 PM  Dr. Delia Heady, Stroke Center Medical Director, has personally reviewed chart, pertinent data, examined the patient and developed the plan of care. Pager:  [phone]

## 2012-05-15 NOTE — Progress Notes (Signed)
Patient discharge instructions and education given. Stroke Education Manual given to patient. Risk factors, S/S of stroke and stroke prevention taught. Patient's questions were answered to satisfaction. Pt D/C home with no signs of acute distress.

## 2012-05-15 NOTE — Progress Notes (Signed)
Physical Therapy Treatment Patient Details Name: Theresa Sexton MRN: [id number] DOB: [birth year] Today's Date: 05/15/2012 Time: 4098-1191 PT Time Calculation (min): 14 min  PT Assessment / Plan / Recommendation Comments on Treatment Session  Pt is near baseline functional level, although recommending supervision for mobility secondary to unsteadiness during ambulation. Will continue to monitor prior to d/c    Follow Up Recommendations  Home health PT;Supervision for mobility/OOB    Barriers to Discharge        Equipment Recommendations  None recommended by PT    Recommendations for Other Services    Frequency Min 3X/week   Plan Discharge plan remains appropriate    Precautions / Restrictions Precautions Precautions: Fall       Mobility  Bed Mobility Bed Mobility: Supine to Sit;Sitting - Scoot to Edge of Bed Supine to Sit: 7: Independent Sitting - Scoot to Edge of Bed: 7: Independent Transfers Transfers: Sit to Stand;Stand to Sit Sit to Stand: 6: Modified independent (Device/Increase time);With upper extremity assist;From bed;From toilet Stand to Sit: 6: Modified independent (Device/Increase time);With upper extremity assist;To toilet;To chair/3-in-1 Ambulation/Gait Ambulation/Gait Assistance: 5: Supervision Ambulation Distance (Feet): 300 Feet Assistive device: None Ambulation/Gait Assistance Details: Shuffling gait with decreased knee flexion and hip flexion throughout gait pattern. Pt describes gait as baseline, no LOB, although slighty unsteady.  Gait Pattern: Step-through pattern;Decreased step length - right;Decreased step length - left;Decreased stride length;Shuffle;Wide base of support Gait velocity: decreased gait speed      PT Goals Acute Rehab PT Goals PT Goal: Sit to Stand - Progress: Progressing toward goal PT Transfer Goal: Bed to Chair/Chair to Bed - Progress: Progressing toward goal PT Goal: Ambulate - Progress: Progressing toward goal  Visit  Information  Last PT Received On: 05/15/12 Assistance Needed: +1       Cognition  Overall Cognitive Status: Appears within functional limits for tasks assessed/performed Arousal/Alertness: Awake/alert Orientation Level: Appears intact for tasks assessed Behavior During Session: [facility] for tasks performed       End of Session PT - End of Session Equipment Utilized During Treatment: Gait belt Activity Tolerance: Patient tolerated treatment well Patient left: in chair;with call bell/phone within reach Nurse Communication: Mobility status     Milana Kidney 05/15/2012, 10:01 AM  05/15/2012 Milana Kidney DPT PAGER: [phone] OFFICE: [phone]

## 2012-06-10 NOTE — Discharge Summary (Signed)
Physician Discharge Summary  Patient ID: Theresa Sexton MRN: [id number] DOB/AGE: May 07, 1933 78 y.o.  Admit date: 05/12/2012 Discharge date: 06/10/2012  Primary Care Physician:  Joycelyn Rua, MD   Discharge Diagnoses:    Principal Problem:  *Cerebral hemorrhage Active Problems:  HTN (hypertension), malignant  Diabetes mellitus  Hypercholesterolemia  Right hand weakness  Dehydration  Orthostasis    Medication List  As of 06/10/2012  2:37 PM   STOP taking these medications         aspirin 81 MG chewable tablet         TAKE these medications         amLODipine 10 MG tablet   Commonly known as: NORVASC   Take 1 tablet (10 mg total) by mouth daily.      atenolol 100 MG tablet   Commonly known as: TENORMIN   Take 100 mg by mouth daily.      atorvastatin 40 MG tablet   Commonly known as: LIPITOR   Take 40 mg by mouth daily.             Disposition and Follow-up:  Dr.Sethi in 1 month  Consults: Dr. Pearlean Brownie Neurology  Significant Diagnostic Studies:  Lipid Panel    Component  Value  Date/Time    CHOL  155  05/13/2012 0710    TRIG  95  05/13/2012 0710    HDL  64  05/13/2012 0710    CHOLHDL  2.4  05/13/2012 0710    VLDL  19  05/13/2012 0710    LDLCALC  72  05/13/2012 0710    HgbA1C  Lab Results   Component  Value  Date    HGBA1C  6.5*  05/13/2012    Urine Drug Screen:  No results found for this basename: labopia, cocainscrnur, labbenz, amphetmu, thcu, labbarb    Alcohol Level: No results found for this basename: ETH:2 in the last 168 hours  Theresa Sexton 05/14/2012 Bone marrow lesions at C7-T1, 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 Negative for fracture. Cervical spondylosis of a moderate degree. This is most severe at C4-5 where there is moderate spinal stenosis. Spondylosis is primarily on the left side and there is significant left foraminal encroachment at C4-5, C5-6, and C6-7.  Theresa Sexton 05/12/2012 New focal hyperdensity within the left pons,  which may represent acute hemorrhage.  Theresa Sexton 05/13/2012 1. Focal hemorrhagic infarct of the posterior left frontal lobe. The hemorrhage is new since the prior study. 2. Focal hemorrhagic infarct within the central pons with surrounding edema. 3. Otherwise stable atrophy and extensive white matter disease, likely reflecting the sequelae of chronic microvascular ischemia. 4. Minimal maxillary sinus mucosal thickening. 5. Small right mastoid effusion without an obstructing nasopharyngeal lesion.  Theresa Sexton 05/13/2012 1. Medium and small vessel disease is exaggerated by patient motion. 2. Significant signal loss in the vertebral arteries as well as the pre cavernous cavernous Theresa arteries is at least in part artifactual. 3. Signal loss in the proximal M2 segments bilaterally is artifactual.  Theresa Sexton EF 60-65% with no source of embolus. thickness of the septum, consistent with lipomatous hypertrophy  Theresa Sexton Bilaterally 40-59% ICA stenosis, highest end of scale. Bilateral antegrade vertebral flow. Right subclavian artery is within normal limits.  Theresa Sexton 05/12/2012 No evidence of active pulmonary disease.  Theresa Sexton normal sinus rhythm, nonspecific ST and T waves changes.  Therapy Recommendations PT - home health, OT -none    Brief H and P: 78  year old with PMH significant for HTN, DM, hypercholesterolemia, who was refer to Dr Everlene Balls for stroke work up. Patient has has right hand weakness, dropping objects, and right arm numbness since 4 weeks. She was at Dr Everlene Balls office today and was found to have blood pressure at 240/110. She was refer for admission for malignant HTN and work up for stroke. Patient confirm weakness or right hand and numbness. She denies slurred speech, dysphagia, lower extremities weakness, vision changes, chest pain, dyspnea. She denies headache, neck pain  Hospital Course:  Theresa Sexton is a 78 y.o. female with a left posterior frontal lobe and  central pontine hemorrhage secondary to malignant hypertension. On aspirin 81 mg orally every day prior to admission. Now on no antiplatlets for secondary stroke prevention now given hemorrhage.  Underwent work up as noted above Patient with resultant right hand weakness. OP PT recommended.  -malignant hypertension, BP 240/110 on admission, initially treated with IV antihypertensives, transitioned to PO medications -right hand weakness, differential includes carpal tunnel, right C8 or T1 radiculopathy, brachial plexus lesion.  -diabetes -diet controlled, Aic 6.5 -hyperlipidemia-lipitor    Time spent on Discharge:  SignedZannie Cove [facility] Pager: [phone] 06/10/2012, 2:37 PM

## 2015-01-23 ENCOUNTER — Other Ambulatory Visit: Payer: Self-pay | Admitting: Family Medicine

## 2015-01-23 DIAGNOSIS — Z87898 Personal history of other specified conditions: Secondary | ICD-10-CM

## 2015-01-24 ENCOUNTER — Ambulatory Visit (INDEPENDENT_AMBULATORY_CARE_PROVIDER_SITE_OTHER): Payer: Medicare Other

## 2015-01-24 DIAGNOSIS — Z8669 Personal history of other diseases of the nervous system and sense organs: Secondary | ICD-10-CM

## 2015-01-24 DIAGNOSIS — Z87898 Personal history of other specified conditions: Secondary | ICD-10-CM

## 2015-01-24 DIAGNOSIS — R413 Other amnesia: Secondary | ICD-10-CM | POA: Diagnosis not present

## 2019-08-19 ENCOUNTER — Other Ambulatory Visit: Payer: Self-pay

## 2019-08-19 ENCOUNTER — Emergency Department (HOSPITAL_COMMUNITY): Payer: Medicare Other

## 2019-08-19 ENCOUNTER — Inpatient Hospital Stay (HOSPITAL_COMMUNITY): Payer: Medicare Other

## 2019-08-19 ENCOUNTER — Inpatient Hospital Stay (HOSPITAL_COMMUNITY)
Admission: EM | Admit: 2019-08-19 | Discharge: 2019-08-29 | DRG: 871 | Disposition: E | Payer: Medicare Other | Source: Skilled Nursing Facility | Attending: Internal Medicine | Admitting: Internal Medicine

## 2019-08-19 ENCOUNTER — Encounter (HOSPITAL_COMMUNITY): Payer: Self-pay

## 2019-08-19 DIAGNOSIS — E870 Hyperosmolality and hypernatremia: Secondary | ICD-10-CM | POA: Diagnosis present

## 2019-08-19 DIAGNOSIS — E7800 Pure hypercholesterolemia, unspecified: Secondary | ICD-10-CM | POA: Diagnosis present

## 2019-08-19 DIAGNOSIS — Z66 Do not resuscitate: Secondary | ICD-10-CM | POA: Diagnosis present

## 2019-08-19 DIAGNOSIS — N179 Acute kidney failure, unspecified: Secondary | ICD-10-CM | POA: Diagnosis present

## 2019-08-19 DIAGNOSIS — Z8673 Personal history of transient ischemic attack (TIA), and cerebral infarction without residual deficits: Secondary | ICD-10-CM | POA: Diagnosis not present

## 2019-08-19 DIAGNOSIS — E785 Hyperlipidemia, unspecified: Secondary | ICD-10-CM | POA: Diagnosis present

## 2019-08-19 DIAGNOSIS — J9601 Acute respiratory failure with hypoxia: Secondary | ICD-10-CM | POA: Diagnosis present

## 2019-08-19 DIAGNOSIS — Z79899 Other long term (current) drug therapy: Secondary | ICD-10-CM | POA: Diagnosis not present

## 2019-08-19 DIAGNOSIS — G9341 Metabolic encephalopathy: Secondary | ICD-10-CM | POA: Diagnosis present

## 2019-08-19 DIAGNOSIS — R6520 Severe sepsis without septic shock: Secondary | ICD-10-CM | POA: Diagnosis present

## 2019-08-19 DIAGNOSIS — G309 Alzheimer's disease, unspecified: Secondary | ICD-10-CM | POA: Diagnosis present

## 2019-08-19 DIAGNOSIS — F0280 Dementia in other diseases classified elsewhere without behavioral disturbance: Secondary | ICD-10-CM | POA: Diagnosis not present

## 2019-08-19 DIAGNOSIS — E872 Acidosis: Secondary | ICD-10-CM | POA: Diagnosis present

## 2019-08-19 DIAGNOSIS — I10 Essential (primary) hypertension: Secondary | ICD-10-CM | POA: Diagnosis present

## 2019-08-19 DIAGNOSIS — E875 Hyperkalemia: Secondary | ICD-10-CM | POA: Diagnosis present

## 2019-08-19 DIAGNOSIS — F0390 Unspecified dementia without behavioral disturbance: Secondary | ICD-10-CM | POA: Diagnosis not present

## 2019-08-19 DIAGNOSIS — Z880 Allergy status to penicillin: Secondary | ICD-10-CM | POA: Diagnosis not present

## 2019-08-19 DIAGNOSIS — N170 Acute kidney failure with tubular necrosis: Secondary | ICD-10-CM | POA: Diagnosis not present

## 2019-08-19 DIAGNOSIS — E119 Type 2 diabetes mellitus without complications: Secondary | ICD-10-CM | POA: Diagnosis present

## 2019-08-19 DIAGNOSIS — U071 COVID-19: Secondary | ICD-10-CM | POA: Diagnosis present

## 2019-08-19 DIAGNOSIS — Z515 Encounter for palliative care: Secondary | ICD-10-CM | POA: Diagnosis not present

## 2019-08-19 DIAGNOSIS — R0603 Acute respiratory distress: Secondary | ICD-10-CM | POA: Diagnosis not present

## 2019-08-19 DIAGNOSIS — A419 Sepsis, unspecified organism: Secondary | ICD-10-CM | POA: Diagnosis not present

## 2019-08-19 DIAGNOSIS — N390 Urinary tract infection, site not specified: Secondary | ICD-10-CM | POA: Diagnosis present

## 2019-08-19 DIAGNOSIS — E860 Dehydration: Secondary | ICD-10-CM | POA: Diagnosis present

## 2019-08-19 DIAGNOSIS — R8290 Unspecified abnormal findings in urine: Secondary | ICD-10-CM | POA: Diagnosis present

## 2019-08-19 DIAGNOSIS — A4189 Other specified sepsis: Secondary | ICD-10-CM | POA: Diagnosis not present

## 2019-08-19 LAB — COMPREHENSIVE METABOLIC PANEL
ALT: 14 U/L (ref 0–44)
AST: 21 U/L (ref 15–41)
Albumin: 3.3 g/dL — ABNORMAL LOW (ref 3.5–5.0)
Alkaline Phosphatase: 72 U/L (ref 38–126)
Anion gap: 24 — ABNORMAL HIGH (ref 5–15)
BUN: 178 mg/dL — ABNORMAL HIGH (ref 8–23)
CO2: 17 mmol/L — ABNORMAL LOW (ref 22–32)
Calcium: 9.2 mg/dL (ref 8.9–10.3)
Chloride: 119 mmol/L — ABNORMAL HIGH (ref 98–111)
Creatinine, Ser: 4.34 mg/dL — ABNORMAL HIGH (ref 0.44–1.00)
GFR calc Af Amer: 10 mL/min — ABNORMAL LOW (ref >60)
GFR calc non Af Amer: 9 mL/min — ABNORMAL LOW (ref >60)
Glucose, Bld: 159 mg/dL — ABNORMAL HIGH (ref 70–99)
Potassium: 5.4 mmol/L — ABNORMAL HIGH (ref 3.5–5.1)
Sodium: 160 mmol/L — ABNORMAL HIGH (ref 135–145)
Total Bilirubin: 1.2 mg/dL (ref 0.3–1.2)
Total Protein: 7.5 g/dL (ref 6.5–8.1)

## 2019-08-19 LAB — URINALYSIS, ROUTINE W REFLEX MICROSCOPIC
Bacteria, UA: NONE SEEN
Glucose, UA: NEGATIVE mg/dL
Hgb urine dipstick: NEGATIVE
Ketones, ur: NEGATIVE mg/dL
Nitrite: NEGATIVE
Protein, ur: 30 mg/dL — AB
Specific Gravity, Urine: 1.023 (ref 1.005–1.030)
pH: 5 (ref 5.0–8.0)

## 2019-08-19 LAB — CBC WITH DIFFERENTIAL/PLATELET
Abs Immature Granulocytes: 0.15 10*3/uL — ABNORMAL HIGH (ref 0.00–0.07)
Basophils Absolute: 0 10*3/uL (ref 0.0–0.1)
Basophils Relative: 0 %
Eosinophils Absolute: 0 10*3/uL (ref 0.0–0.5)
Eosinophils Relative: 0 %
HCT: 50.5 % — ABNORMAL HIGH (ref 36.0–46.0)
Hemoglobin: 15.5 g/dL — ABNORMAL HIGH (ref 12.0–15.0)
Immature Granulocytes: 1 %
Lymphocytes Relative: 2 %
Lymphs Abs: 0.4 10*3/uL — ABNORMAL LOW (ref 0.7–4.0)
MCH: 30.6 pg (ref 26.0–34.0)
MCHC: 30.7 g/dL (ref 30.0–36.0)
MCV: 99.6 fL (ref 80.0–100.0)
Monocytes Absolute: 0.9 10*3/uL (ref 0.1–1.0)
Monocytes Relative: 6 %
Neutro Abs: 15.1 10*3/uL — ABNORMAL HIGH (ref 1.7–7.7)
Neutrophils Relative %: 91 %
Platelets: 237 10*3/uL (ref 150–400)
RBC: 5.07 MIL/uL (ref 3.87–5.11)
RDW: 14.6 % (ref 11.5–15.5)
WBC: 16.7 10*3/uL — ABNORMAL HIGH (ref 4.0–10.5)
nRBC: 0 % (ref 0.0–0.2)

## 2019-08-19 LAB — SODIUM, URINE, RANDOM: Sodium, Ur: <10 mmol/L

## 2019-08-19 LAB — D-DIMER, QUANTITATIVE: D-Dimer, Quant: 1.16 ug/mL-FEU — ABNORMAL HIGH (ref 0.00–0.50)

## 2019-08-19 LAB — GLUCOSE, CAPILLARY: Glucose-Capillary: 302 mg/dL — ABNORMAL HIGH (ref 70–99)

## 2019-08-19 LAB — BASIC METABOLIC PANEL
Anion gap: 10 (ref 5–15)
BUN: 177 mg/dL — ABNORMAL HIGH (ref 8–23)
CO2: 25 mmol/L (ref 22–32)
Calcium: 8.2 mg/dL — ABNORMAL LOW (ref 8.9–10.3)
Chloride: 120 mmol/L — ABNORMAL HIGH (ref 98–111)
Creatinine, Ser: 4.33 mg/dL — ABNORMAL HIGH (ref 0.44–1.00)
GFR calc Af Amer: 10 mL/min — ABNORMAL LOW (ref >60)
GFR calc non Af Amer: 9 mL/min — ABNORMAL LOW (ref >60)
Glucose, Bld: 353 mg/dL — ABNORMAL HIGH (ref 70–99)
Potassium: 5.4 mmol/L — ABNORMAL HIGH (ref 3.5–5.1)
Sodium: 155 mmol/L — ABNORMAL HIGH (ref 135–145)

## 2019-08-19 LAB — CBG MONITORING, ED: Glucose-Capillary: 260 mg/dL — ABNORMAL HIGH (ref 70–99)

## 2019-08-19 LAB — MRSA PCR SCREENING: MRSA by PCR: POSITIVE — AB

## 2019-08-19 LAB — LACTIC ACID, PLASMA
Lactic Acid, Venous: 2.2 mmol/L (ref 0.5–1.9)
Lactic Acid, Venous: 2.1 mmol/L (ref 0.5–1.9)

## 2019-08-19 LAB — LACTATE DEHYDROGENASE: LDH: 361 U/L — ABNORMAL HIGH (ref 98–192)

## 2019-08-19 LAB — ABO/RH: ABO/RH(D): A POS

## 2019-08-19 LAB — SARS CORONAVIRUS 2 (TAT 6-24 HRS): SARS Coronavirus 2: POSITIVE — AB

## 2019-08-19 LAB — FERRITIN: Ferritin: 200 ng/mL (ref 11–307)

## 2019-08-19 LAB — PROCALCITONIN: Procalcitonin: 0.12 ng/mL

## 2019-08-19 LAB — TRIGLYCERIDES: Triglycerides: 277 mg/dL — ABNORMAL HIGH (ref <150)

## 2019-08-19 LAB — C-REACTIVE PROTEIN: CRP: 1.5 mg/dL — ABNORMAL HIGH (ref <1.0)

## 2019-08-19 LAB — CREATININE, URINE, RANDOM: Creatinine, Urine: 364.75 mg/dL

## 2019-08-19 LAB — FIBRINOGEN: Fibrinogen: 334 mg/dL (ref 210–475)

## 2019-08-19 MED ORDER — INSULIN ASPART 100 UNIT/ML ~~LOC~~ SOLN
0.0000 [IU] | Freq: Four times a day (QID) | SUBCUTANEOUS | Status: DC
  Administered 2019-08-19: 5 [IU] via SUBCUTANEOUS

## 2019-08-19 MED ORDER — SODIUM CHLORIDE 0.9 % IV SOLN: 2.0000 g | INTRAVENOUS | Status: DC

## 2019-08-19 MED ORDER — ONDANSETRON HCL 4 MG PO TABS: 4.0000 mg | ORAL_TABLET | Freq: Four times a day (QID) | ORAL | Status: DC | PRN

## 2019-08-19 MED ORDER — GUAIFENESIN-DM 100-10 MG/5ML PO SYRP: 10.0000 mL | ORAL_SOLUTION | ORAL | Status: DC | PRN

## 2019-08-19 MED ORDER — SODIUM CHLORIDE 0.9 % IV SOLN
1.0000 g | Freq: Three times a day (TID) | INTRAVENOUS | Status: DC
  Filled 2019-08-19 (×2): qty 1

## 2019-08-19 MED ORDER — INSULIN ASPART 100 UNIT/ML ~~LOC~~ SOLN
0.0000 [IU] | Freq: Four times a day (QID) | SUBCUTANEOUS | Status: DC
  Administered 2019-08-19: 11 [IU] via SUBCUTANEOUS
  Administered 2019-08-20 (×2): 2 [IU] via SUBCUTANEOUS

## 2019-08-19 MED ORDER — MUPIROCIN 2 % EX OINT
1.0000 "application " | TOPICAL_OINTMENT | Freq: Two times a day (BID) | CUTANEOUS | Status: DC
  Administered 2019-08-20 (×2): 1 via NASAL
  Filled 2019-08-19: qty 22

## 2019-08-19 MED ORDER — SODIUM CHLORIDE 0.9% FLUSH
3.0000 mL | Freq: Two times a day (BID) | INTRAVENOUS | Status: DC
  Administered 2019-08-20: 3 mL via INTRAVENOUS

## 2019-08-19 MED ORDER — ZINC SULFATE 220 (50 ZN) MG PO CAPS
220.0000 mg | ORAL_CAPSULE | Freq: Every day | ORAL | Status: DC
  Filled 2019-08-19: qty 1

## 2019-08-19 MED ORDER — LIDOCAINE HCL (PF) 1 % IJ SOLN
INTRAMUSCULAR | Status: AC
  Administered 2019-08-19: 14:00:00
  Filled 2019-08-19: qty 5

## 2019-08-19 MED ORDER — SODIUM CHLORIDE 0.9 % IV SOLN
1000.0000 mL | INTRAVENOUS | Status: DC
  Administered 2019-08-19: 1000 mL via INTRAVENOUS

## 2019-08-19 MED ORDER — ONDANSETRON HCL 4 MG/2ML IJ SOLN: 4.0000 mg | Freq: Four times a day (QID) | INTRAMUSCULAR | Status: DC | PRN

## 2019-08-19 MED ORDER — CHLORHEXIDINE GLUCONATE CLOTH 2 % EX PADS
6.0000 | MEDICATED_PAD | Freq: Every day | CUTANEOUS | Status: DC
  Administered 2019-08-20: 6 via TOPICAL

## 2019-08-19 MED ORDER — CHLORHEXIDINE GLUCONATE 0.12 % MT SOLN
15.0000 mL | Freq: Two times a day (BID) | OROMUCOSAL | Status: DC
  Administered 2019-08-19 – 2019-08-20 (×2): 15 mL via OROMUCOSAL
  Filled 2019-08-19: qty 15

## 2019-08-19 MED ORDER — DEXTROSE 5 % IV SOLN
500.0000 mg | Freq: Three times a day (TID) | INTRAVENOUS | Status: DC
  Administered 2019-08-19 – 2019-08-20 (×2): 500 mg via INTRAVENOUS
  Filled 2019-08-19 (×7): qty 0.5

## 2019-08-19 MED ORDER — ALBUTEROL SULFATE HFA 108 (90 BASE) MCG/ACT IN AERS
2.0000 | INHALATION_SPRAY | Freq: Four times a day (QID) | RESPIRATORY_TRACT | Status: DC
  Administered 2019-08-20: 2 via RESPIRATORY_TRACT
  Filled 2019-08-19: qty 6.7

## 2019-08-19 MED ORDER — ORAL CARE MOUTH RINSE
15.0000 mL | Freq: Two times a day (BID) | OROMUCOSAL | Status: DC
  Administered 2019-08-20: 15 mL via OROMUCOSAL

## 2019-08-19 MED ORDER — SODIUM CHLORIDE 0.9 % IV BOLUS (SEPSIS)
500.0000 mL | Freq: Once | INTRAVENOUS | Status: AC
  Administered 2019-08-19: 500 mL via INTRAVENOUS

## 2019-08-19 MED ORDER — HYDROCOD POLST-CPM POLST ER 10-8 MG/5ML PO SUER: 5.0000 mL | Freq: Two times a day (BID) | ORAL | Status: DC | PRN

## 2019-08-19 MED ORDER — LACTATED RINGERS IV BOLUS
1000.0000 mL | Freq: Once | INTRAVENOUS | Status: AC
  Administered 2019-08-19: 1000 mL via INTRAVENOUS

## 2019-08-19 MED ORDER — ACETAMINOPHEN 325 MG PO TABS: 650.0000 mg | ORAL_TABLET | Freq: Four times a day (QID) | ORAL | Status: DC | PRN

## 2019-08-19 MED ORDER — HEPARIN SODIUM (PORCINE) 5000 UNIT/ML IJ SOLN
5000.0000 [IU] | Freq: Three times a day (TID) | INTRAMUSCULAR | Status: DC
  Administered 2019-08-19 – 2019-08-20 (×2): 5000 [IU] via SUBCUTANEOUS
  Filled 2019-08-19 (×2): qty 1

## 2019-08-19 MED ORDER — DEXAMETHASONE SODIUM PHOSPHATE 10 MG/ML IJ SOLN
6.0000 mg | INTRAMUSCULAR | Status: DC
  Administered 2019-08-20: 6 mg via INTRAVENOUS
  Filled 2019-08-19: qty 1

## 2019-08-19 MED ORDER — VITAMIN C 500 MG PO TABS: 500.0000 mg | ORAL_TABLET | Freq: Every day | ORAL | Status: DC

## 2019-08-19 MED ORDER — DEXTROSE 5 % IV SOLN
INTRAVENOUS | Status: DC
  Administered 2019-08-19 (×2): via INTRAVENOUS

## 2019-08-19 NOTE — ED Triage Notes (Signed)
Pt from [facility] via ems; called out for unresponsiveness; on ems arrival, pt combative, which staff states is baseline; pt covid positive; pt breathing 8 breaths per mintue, EMS assisting with ventilations on arrival; pt given 5 albuterl, 0.5 atrovent, 125 solumedrol, 2 g magnesium PTA; ems states unable to get SPO2 reading above 90%; sinus tach on monitor, BP 106/80; per staff, pt non-communicative at baseline; CBG 210

## 2019-08-19 NOTE — ED Notes (Signed)
Daughter, peggy [phone]

## 2019-08-19 NOTE — ED Provider Notes (Signed)
[facility] EMERGENCY DEPARTMENT Provider Note   CSN: [id number] Arrival date & time: 08/21/2019  1116     History   Chief Complaint Chief Complaint  Patient presents with  . Respiratory Distress    HPI Theresa Sexton is a 85 y.o. female.     HPI Patient has a history of cerebral hemorrhage and baseline diminished mental status.  She is a resident of a nursing facility.  Patient is currently being treated on the Covid unit at the nursing facility.  According to the EMS report, the patient was sent to the ED for decreased responsiveness.  On EMS arrival they noted the patient to be combative which is her baseline.  EMS gave the patient 5 mg of albuterol 0.5 of Atrovent, 125 of Solu-Medrol, 2 g of magnesium and assisted her ventilation.  They were unable to get an oxygen saturation above 90%.  They noted sinus tachycardia.  Patient was brought to the ED for further evaluation.  Patient at baseline is unable to communicate.  She moans in response to treatments. Past Medical History:  Diagnosis Date  . Diabetes mellitus   . High cholesterol   . Hypertension     Patient Active Problem List   Diagnosis Date Noted  . Cerebral hemorrhage ([place]) 05/15/2012  . Dehydration 05/13/2012  . Orthostasis 05/13/2012  . HTN (hypertension), malignant 05/12/2012  . Diabetes mellitus ([place]) 05/12/2012  . Hypercholesterolemia 05/12/2012  . Right hand weakness 05/12/2012    Past Surgical History:  Procedure Laterality Date  . NO PAST SURGERIES       OB History   No obstetric history on file.      Home Medications    Prior to Admission medications   Medication Sig Start Date End Date Taking? Authorizing Provider  amLODipine (NORVASC) 10 MG tablet Take 1 tablet (10 mg total) by mouth daily. 05/15/12 05/15/13  Domenic Polite, MD  atenolol (TENORMIN) 100 MG tablet Take 100 mg by mouth daily.    [provider]  atorvastatin (LIPITOR) 40 MG tablet Take 40 mg by  mouth daily.    [provider]    Family History History reviewed. No pertinent family history.  Social History Social History   Tobacco Use  . Smoking status: Never Smoker  . Smokeless tobacco: Never Used  Substance Use Topics  . Alcohol use: No  . Drug use: No     Allergies   Penicillins   Review of Systems Review of Systems  All other systems reviewed and are negative.    Physical Exam Updated Vital Signs BP 122/62   Pulse 88   Temp (!) 95.8 F (35.4 C) (Rectal)   Resp (!) 26   Ht 1.638 m (5' 4.5")   Wt 75.8 kg   SpO2 100%   BMI 28.24 kg/m   Physical Exam Vitals signs and nursing note reviewed.  Constitutional:      Appearance: She is ill-appearing.     Comments: Cachectic  HENT:     Head: Normocephalic and atraumatic.     Right Ear: External ear normal.     Left Ear: External ear normal.     Mouth/Throat:     Mouth: Mucous membranes are dry.     Comments: Mucous membranes are dry Eyes:     General: No scleral icterus.       Right eye: No discharge.        Left eye: No discharge.     Conjunctiva/sclera: Conjunctivae normal.  Neck:     Musculoskeletal: Neck supple.     Trachea: No tracheal deviation.  Cardiovascular:     Rate and Rhythm: Normal rate and regular rhythm.  Pulmonary:     Effort: Pulmonary effort is normal. No respiratory distress.     Breath sounds: Normal breath sounds. No stridor. No wheezing or rales.  Abdominal:     General: Bowel sounds are normal. There is no distension.     Palpations: Abdomen is soft.     Tenderness: There is no abdominal tenderness. There is no guarding or rebound.  Musculoskeletal:        General: No tenderness.  Skin:    General: Skin is warm and dry.     Findings: No rash.  Neurological:     Cranial Nerves: No cranial nerve deficit (no facial droop noted).     Sensory: No sensory deficit.     Motor: No abnormal muscle tone or seizure activity.     Comments: Patient will move all  extremities in response to exam, she pulls at the masks and IVs with both hands      ED Treatments / Results  Labs (all labs ordered are listed, but only abnormal results are displayed) Labs Reviewed  LACTIC ACID, PLASMA - Abnormal; Notable for the following components:      Result Value   Lactic Acid, Venous 2.2 (*)    All other components within normal limits  CBC WITH DIFFERENTIAL/PLATELET - Abnormal; Notable for the following components:   WBC 16.7 (*)    Hemoglobin 15.5 (*)    HCT 50.5 (*)    Neutro Abs 15.1 (*)    Lymphs Abs 0.4 (*)    Abs Immature Granulocytes 0.15 (*)    All other components within normal limits  COMPREHENSIVE METABOLIC PANEL - Abnormal; Notable for the following components:   Sodium 160 (*)    Potassium 5.4 (*)    Chloride 119 (*)    CO2 17 (*)    Glucose, Bld 159 (*)    BUN 178 (*)    Creatinine, Ser 4.34 (*)    Albumin 3.3 (*)    GFR calc non Af Amer 9 (*)    GFR calc Af Amer 10 (*)    Anion gap 24 (*)    All other components within normal limits  D-DIMER, QUANTITATIVE (NOT AT [facility]) - Abnormal; Notable for the following components:   D-Dimer, Quant 1.16 (*)    All other components within normal limits  LACTATE DEHYDROGENASE - Abnormal; Notable for the following components:   LDH 361 (*)    All other components within normal limits  TRIGLYCERIDES - Abnormal; Notable for the following components:   Triglycerides 277 (*)    All other components within normal limits  CULTURE, BLOOD (ROUTINE X 2)  CULTURE, BLOOD (ROUTINE X 2)  SARS CORONAVIRUS 2 (TAT 6-24 HRS)  PROCALCITONIN  FIBRINOGEN  FERRITIN  C-REACTIVE PROTEIN  I-STAT VENOUS BLOOD GAS, ED  I-STAT ARTERIAL BLOOD GAS, ED  I-STAT ARTERIAL BLOOD GAS, ED    EKG EKG Interpretation  Date/Time:  Thursday 2019/08/24 11:11:37 EDT Ventricular Rate:  97 PR Interval:    QRS Duration: 82 QT Interval:  375 QTC Calculation: 477 R Axis:   14 Text Interpretation:  Age not entered,  assumed to be  85 years old for purpose of ECG interpretation Sinus rhythm Right atrial enlargement Anterior infarct, old Nonspecific T abnormalities, lateral leads Confirmed by Linwood Dibbles [phone]) on August 24, 2019  2:13:31 PM   Radiology Dg Chest Port 1 View  Result Date: 2019/08/24 CLINICAL DATA:  COVID-19 positive with worsening symptoms, initial encounter EXAM: PORTABLE CHEST 1 VIEW COMPARISON:  01/09/2015 FINDINGS: Cardiac shadows within normal limits. Aortic calcifications are seen. The lungs are well aerated bilaterally. No focal infiltrate or sizable effusion is noted. Stable calcified granuloma is noted in the left lung. No bony abnormality is seen. IMPRESSION: No acute abnormality noted. Electronically Signed   By: Alcide Clever M.D.   On: August 24, 2019 12:31    Procedures .Critical Care Performed by: Linwood Dibbles, MD Authorized by: Linwood Dibbles, MD   Critical care provider statement:    Critical care time (minutes):  45   Critical care was time spent personally by me on the following activities:  Discussions with consultants, evaluation of patient's response to treatment, examination of patient, ordering and performing treatments and interventions, ordering and review of laboratory studies, ordering and review of radiographic studies, pulse oximetry, re-evaluation of patient's condition, obtaining history from patient or surrogate and review of old charts   (including critical care time)  Medications Ordered in ED Medications  sodium chloride 0.9 % bolus 500 mL (0 mLs Intravenous Stopped 08/24/19 1317)    Followed by  0.9 %  sodium chloride infusion (1,000 mLs Intravenous New Bag/Given Aug 24, 2019 1325)  lidocaine (PF) (XYLOCAINE) 1 % injection (  Given by Other 2019/08/24 1352)  lactated ringers bolus 1,000 mL (1,000 mLs Intravenous New Bag/Given 08/24/2019 1422)     Initial Impression / Assessment and Plan / ED Course  I have reviewed the triage vital signs and the nursing notes.   Pertinent labs & imaging results that were available during my care of the patient were reviewed by me and considered in my medical decision making (see chart for details).  Clinical Course as of Aug 19 1439  Thu 08-24-19  1128 Difficult to get a clear oxygen saturation but appears to be low 90s.  Patient does not seem to be in respiratory distress at this time.  We will hold off on intubation and continue with supportive care at this time.   [JK]  1130 Attempted to contact pt's daughter.   [JK]  1135 Attempted to call pt's son.   [JK]  1407 Multiple attempts by respiratory to attempt ABG unsuccessful.   Labs notable for AKI, hypernatremia, metabolic acidosis.  D-dimer elevated.  White blood cell count elevated.   [JK]  1411 Chest x-ray without pneumonia.  Continue IV hydration considering her severe dehydration despite her COVID-19 diagnosis.  Patient may benefit palliative care however I am unable to reach family at this time.  I will consult the medical service for admission.   [JK]  1439 I spoke with the daughter and updated her on the status.  Explained to her that her mother is extremely dehydrated and critically ill.  We will continue with supportive care.  I did recommend nonaggressive measures I have made the patient DNR.  I informed the daughter.   [JK]    Clinical Course User Index [JK] Linwood Dibbles, MD     Patient presented to the ED for evaluation of worsening symptoms associated with COVID-19 diagnosis.  Patient is a resident of a nursing facility.  While in the ED were not picking up a good oxygen saturation tracing but she seems to be holding in the 90s.  Unable to obtain ABG after several attempts.  I did perform a femoral arterial stick and obtain the blood  but respiratory was not able to use that blood.  I do not think further attempts are indicated at this time.  We will continue with supplemental oxygen.  Patient does not seem to be in need for intubation.  Laboratory  tests are notable for severe dehydration.  She is hyponatremic and also has metabolic acidosis.  I have attempted to contact family but have been unable to do so.  I do not think the patient would benefit from aggressive care.  However we will continue with fluids and supportive care for COVID-19 until we can clarify goals of care with the family.  I will consult the medical service for admission.  Final Clinical Impressions(s) / ED Diagnoses   Final diagnoses:  COVID-19 virus infection  AKI (acute kidney injury) (HCC)  Hypernatremia      Linwood DibblesKnapp, Genora Arp, MD 18-May-2019 1440

## 2019-08-19 NOTE — Progress Notes (Signed)
RT NOTE: RT x2 attempted ABG with no success. RN and Knapp,MD aware.

## 2019-08-19 NOTE — Progress Notes (Signed)
Pharmacy Antibiotic Note  Theresa Sexton is a 85 y.o. female with PMH of dementia, DMII, HLD, CVA who presents from the [place] unit at her nursing facility with reports of decreased responsiveness. There is concern for a UTI in addition to her viral infection. Pt has a penicillin allergy with an unknown reaction. She has no history of receiving previous cephalosporins.  Pharmacy has been consulted for aztreonam dosing.  WBC is elevated at 16.7. PCT is wnls at 0.12. CRP is elevated at 1.5. Lactate is elevated at 2.2. Pt's Scr is elevated at 4.34 (unknown baseline).  Plan: Aztreonam 500 mg IV q8 hours Monitor renal function, WBC, temp, lactate, and clinical status   Height: 5' 4.5" (163.8 cm) Weight: 167 lb 1.7 oz (75.8 kg) IBW/kg (Calculated) : 55.85  Temp (24hrs), Avg:96.6 F (35.9 C), Min:95.8 F (35.4 C), Max:97.4 F (36.3 C)  Recent Labs  Lab 08/02/2019 1150  WBC 16.7*  CREATININE 4.34*  LATICACIDVEN 2.2*    Estimated Creatinine Clearance: 9.7 mL/min (A) (by C-G formula based on SCr of 4.34 mg/dL (H)).    Allergies  Allergen Reactions  . Penicillins Other (See Comments)    Reaction unkown    Antimicrobials this admission: Aztreonam 10/22 >>  Microbiology results: 10/22 BCx: Sent 10/22 COVID: Sent  Thank you for allowing pharmacy to be a part of this patient's care.  Sherren Kerns, PharmD PGY1 Acute Care Pharmacy Resident 08/08/2019 7:29 PM

## 2019-08-19 NOTE — ED Notes (Signed)
Stuck pt.x3 unable to get blood no blood return nurse is awear she gonna call iv team and also dr.knapp is wear.

## 2019-08-19 NOTE — ED Notes (Signed)
bair hugger applied per Dr. Tomi Bamberger verbal order

## 2019-08-19 NOTE — Progress Notes (Signed)
Updated son via phone - pt arrived on unit, resting comfortably - restated visitor policy and that this RN will reach out to AM staff to clarify visitor policy/any exceptions pending hospital approval to pt's daughter.

## 2019-08-19 NOTE — H&P (Signed)
History and Physical    Theresa Sexton VQQ:[id number] DOB: [birth year] DOA: 08/14/2019  Referring MD/NP/PA: Linwood Dibbles, MD PCP: Joycelyn Rua, MD  Patient coming from: [facility]  Chief Complaint: Altered mental status  I have personally briefly reviewed patient's old medical records in [facility]   HPI: Theresa Sexton is a 85 y.o. female with medical history significant of dementia, DM, HLD, CVA, and DM type II.  Patient presents from the Covid unit of her nursing facility with reports of decreased responsiveness.  The patient unable to give any history she is currently altered.  Of the phone daughter notes that she was initially diagnosed with COVID-19 approximately 12 days ago and she has been unable to see her mother since March.  Her mother's dementia is noted to be pretty significant and has difficulty recognizing family members.  En route with EMS patient was placed on 10 L of nasal cannula oxygen given 125 mg of Solu-Medrol, 2 g of magnesium sulfate, and DuoNeb breathing treatment.  ED Course: Upon admission into the emergency department patient was seen to have a temperature of 95.8 F, blood pressures maintained, respirations up to 26, and O2 saturations noted to be maintained currently on 4 L nasal cannula oxygen.  Labs significant for WBC 16.7, hemoglobin 15.5, m16t,.cm5.4, chloride 119, CO2 17, BUN 178, creatinine 4.34, glucose 159, anion gap 24, LDH 361, triglycerides 277, ferritin 200, CRP 1.5, lactic acid 2.2, and procalcitonin 0.12. Chest x-ray showed no acute abnormalities. Patient had been given 1.5 L of IV fluids and then placed on a rate of 75 mL/h  Review of Systems  Unable to perform ROS: Patient unresponsive    Past Medical History:  Diagnosis Date  . Diabetes mellitus   . High cholesterol   . Hypertension     Past Surgical History:  Procedure Laterality Date  . NO PAST SURGERIES       reports that she has never smoked. She has never used  smokeless tobacco. She reports that she does not drink alcohol or use drugs.  Allergies  Allergen Reactions  . Penicillins Other (See Comments)    Reaction unkown    History reviewed. No pertinent family history.  Prior to Admission medications   Medication Sig Start Date End Date Taking? Authorizing Provider  amLODipine (NORVASC) 10 MG tablet Take 1 tablet (10 mg total) by mouth daily. 05/15/12 05/15/13  Zannie Cove, MD  atenolol (TENORMIN) 100 MG tablet Take 100 mg by mouth daily.    [provider]  atorvastatin (LIPITOR) 40 MG tablet Take 40 mg by mouth daily.    [provider]    Physical Exam:  Constitutional: Elderly female who appears acutely ill and not responding to verbal or noxious stimuli Vitals:   08/17/2019 1530 08/05/2019 1545 07/29/2019 1607 08/07/2019 1615  BP: (!) 117/53 (!) 124/54  (!) 113/48  Pulse: 99 98  95  Resp: (!) 23 18  (!) 23  Temp:   (!) 97.4 F (36.3 C)   TempSrc:   Axillary   SpO2: 100% 99%  99%  Weight:      Height:       Eyes: Pupils are fixed constricted.  Patient ENMT: Mucous membranes are dry. Posterior pharynx clear of any exudate or lesions.  Neck: normal, supple, no masses, no thyromegaly Respiratory: Tachypneic with decreased overall aeration noted.  Patient currently on 4 L nasal cannula oxygen. Cardiovascular: Regular rate and rhythm, no murmurs / rubs / gallops.  No extremity edema. 2+ pedal pulses. No carotid bruits.  Abdomen: no tenderness, no masses palpated. No hepatosplenomegaly. Bowel sounds positive.  Musculoskeletal: no clubbing / cyanosis. No joint deformity upper and lower extremities. Good ROM, no contractures. Normal muscle tone.  Skin: no rashes, lesions, ulcers. No induration.  Poor overall skin turgor. Neurologic: Unable to fully assess at this time due to patient condition. Psychiatric: Unable to assess patient not responsive at this time.   Labs on Admission: I have personally reviewed following labs  and imaging studies  CBC: Recent Labs  Lab 08/21/2019 1150  WBC 16.7*  NEUTROABS 15.1*  HGB 15.5*  HCT 50.5*  MCV 99.6  PLT 211   Basic Metabolic Panel: Recent Labs  Lab 08/21/2019 1150  NA 160*  K 5.4*  CL 119*  CO2 17*  GLUCOSE 159*  BUN 178*  CREATININE 4.34*  CALCIUM 9.2   GFR: Estimated Creatinine Clearance: 9.7 mL/min (A) (by C-G formula based on SCr of 4.34 mg/dL (H)). Liver Function Tests: Recent Labs  Lab 08/12/2019 1150  AST 21  ALT 14  ALKPHOS 72  BILITOT 1.2  PROT 7.5  ALBUMIN 3.3*   No results for input(s): LIPASE, AMYLASE in the last 168 hours. No results for input(s): AMMONIA in the last 168 hours. Coagulation Profile: No results for input(s): INR, PROTIME in the last 168 hours. Cardiac Enzymes: No results for input(s): CKTOTAL, CKMB, CKMBINDEX, TROPONINI in the last 168 hours. BNP (last 3 results) No results for input(s): PROBNP in the last 8760 hours. HbA1C: No results for input(s): HGBA1C in the last 72 hours. CBG: No results for input(s): GLUCAP in the last 168 hours. Lipid Profile: Recent Labs    08/07/2019 1150  TRIG 277*   Thyroid Function Tests: No results for input(s): TSH, T4TOTAL, FREET4, T3FREE, THYROIDAB in the last 72 hours. Anemia Panel: Recent Labs    07/30/2019 1150  FERRITIN 200   Urine analysis: No results found for: COLORURINE, APPEARANCEUR, LABSPEC, PHURINE, GLUCOSEU, HGBUR, BILIRUBINUR, KETONESUR, PROTEINUR, UROBILINOGEN, NITRITE, LEUKOCYTESUR Sepsis Labs: No results found for this or any previous visit (from the past 240 hour(s)).   Radiological Exams on Admission: Dg Chest Port 1 View  Result Date: 08/13/2019 CLINICAL DATA:  COVID-19 positive with worsening symptoms, initial encounter EXAM: PORTABLE CHEST 1 VIEW COMPARISON:  01/09/2015 FINDINGS: Cardiac shadows within normal limits. Aortic calcifications are seen. The lungs are well aerated bilaterally. No focal infiltrate or sizable effusion is noted. Stable  calcified granuloma is noted in the left lung. No bony abnormality is seen. IMPRESSION: No acute abnormality noted. Electronically Signed   By: Inez Catalina M.D.   On: 08/21/2019 12:31    EKG: Independently reviewed.  Sinus rhythm at 96 bpm with nonspecific T wave abnormality appreciated.  Assessment/Plan Acute respiratory failure with hypoxia and sepsis 2/2 to COVID-19 infection: Patient reportedly diagnosed with COVID-19 about 12 days ago.  She had been cared for at the nursing facility in their Covid unit, but noted to be less responsive today.  Patient requiring 4 L nasal cannula oxygen to maintain O2 saturations.  Chest x-ray otherwise clear.  Inflammatory markers elevated.  Discussed patient's overall poor prognosis at this time with family, but have not decided on comfort care measures at this time. -Admit to a progressive bed -Continuous pulse oximetry with nasal cannula oxygen as needed -Follow-up blood cultures and urinalysis -Albuterol inhaler  -Decadron IV daily  -Vitamin C and calcium -Trend lactic acid levels -Continue daily monitoring of inflammatory markers  -  Consider palliative care consult, if no signs of improvement  Acute metabolic encephalopathy, history of dementia: Patient noted to be less responsive.  BUN elevated at 178.  On physical exam noted to have fixed pupils.  Suspect encephalopathy coming from uremia, dehydration, and/or possible underlying stroke.. -Neuro checks -N.p.o. -Check CT scan of the brain without contrast  Acute renal failure, uremia: Patient presents with creatinine elevated up to 4.34 with BUN 178.   Creatinine previously noted to be within normal limits several years ago.  Suspecting secondary to severe dehydration with Covid. -Strict I&Os  -Check urine creatinine and urine sodium -Continue IV fluids   Abnormal urinalysis: UA positive for moderate leukocytes, no bacteria seen, and 6-10 WBCs. -Follow-up urine culture -Empiric antibiotics of  aztreonam per pharmacy  Hypernatremia: On admission patient's sodium 160.  Suspect secondary to dehydration. -Change IV fluids to D5W at 100 mL/h -Continue to monitor serial BMP -Adjust fluids as needed  Hyperkalemia: Potassium initially elevated at 5.4. -IV fluids as seen above -Continue to monitor  Metabolic acidosis with elevated anion gap: Acute initial CO2 17 with anion gap 24.   Diabetes mellitus type 2: Last hemoglobin A1c 6.5 back in 2013. -Hypoglycemic protocols -CBGs with moderate SSI every 6 hours -Adjust insulin regimen as needed  DVT prophylaxis: heparin  Code Status: DNR Family Communication: Discussed plan of care with patient's family over the phone. Disposition Plan: To be determined Consults called: None Admission status: Inpatient  Clydie Braunondell A Smith MD [facility] Pager [phone]   If 7PM-7AM, please contact night-coverage [email or web address] Password [email or web address]  08/01/2019, 4:34 PM

## 2019-08-19 NOTE — ED Notes (Signed)
Pt no longer responding to painful stimuli; Dr. Tomi Bamberger notified

## 2019-08-19 NOTE — ED Notes (Addendum)
bair hugger removed, warm blankets in place

## 2019-08-19 NOTE — Progress Notes (Signed)
Pt has IV fluids ordered currently. Levon Hedger, RN  to have another phlebotomist attempt labs. Doesn't appear to need an additional site at this time.

## 2019-08-19 NOTE — Progress Notes (Signed)
Updated son Jametta [place] via telephone about plan of care for mother. Verbalized understanding. Explained visitor policy. Verbalized understanding but will need to be explained in AM. Explained family can call for updates and that staff will update family with any changes. Verbalized understanding.

## 2019-08-19 NOTE — Progress Notes (Signed)
Received call from [facility]: pt is MRSA positive. MRSA positive standing orders added.

## 2019-08-20 ENCOUNTER — Inpatient Hospital Stay (HOSPITAL_COMMUNITY): Payer: Medicare Other

## 2019-08-20 DIAGNOSIS — Z515 Encounter for palliative care: Secondary | ICD-10-CM

## 2019-08-20 DIAGNOSIS — N170 Acute kidney failure with tubular necrosis: Secondary | ICD-10-CM

## 2019-08-20 DIAGNOSIS — F0280 Dementia in other diseases classified elsewhere without behavioral disturbance: Secondary | ICD-10-CM

## 2019-08-20 DIAGNOSIS — R6520 Severe sepsis without septic shock: Secondary | ICD-10-CM

## 2019-08-20 DIAGNOSIS — A419 Sepsis, unspecified organism: Secondary | ICD-10-CM

## 2019-08-20 DIAGNOSIS — U071 COVID-19: Secondary | ICD-10-CM

## 2019-08-20 DIAGNOSIS — G9341 Metabolic encephalopathy: Secondary | ICD-10-CM

## 2019-08-20 DIAGNOSIS — F0390 Unspecified dementia without behavioral disturbance: Secondary | ICD-10-CM

## 2019-08-20 DIAGNOSIS — J9601 Acute respiratory failure with hypoxia: Secondary | ICD-10-CM

## 2019-08-20 DIAGNOSIS — N179 Acute kidney failure, unspecified: Secondary | ICD-10-CM

## 2019-08-20 LAB — CBC WITH DIFFERENTIAL/PLATELET
Abs Immature Granulocytes: 0.13 10*3/uL — ABNORMAL HIGH (ref 0.00–0.07)
Basophils Absolute: 0 10*3/uL (ref 0.0–0.1)
Basophils Relative: 0 %
Eosinophils Absolute: 0.1 10*3/uL (ref 0.0–0.5)
Eosinophils Relative: 0 %
HCT: 42.2 % (ref 36.0–46.0)
Hemoglobin: 13.3 g/dL (ref 12.0–15.0)
Immature Granulocytes: 1 %
Lymphocytes Relative: 3 %
Lymphs Abs: 0.6 10*3/uL — ABNORMAL LOW (ref 0.7–4.0)
MCH: 32.2 pg (ref 26.0–34.0)
MCHC: 31.5 g/dL (ref 30.0–36.0)
MCV: 102.2 fL — ABNORMAL HIGH (ref 80.0–100.0)
Monocytes Absolute: 1.4 10*3/uL — ABNORMAL HIGH (ref 0.1–1.0)
Monocytes Relative: 7 %
Neutro Abs: 19.6 10*3/uL — ABNORMAL HIGH (ref 1.7–7.7)
Neutrophils Relative %: 89 %
Platelets: 206 10*3/uL (ref 150–400)
RBC: 4.13 MIL/uL (ref 3.87–5.11)
RDW: 14.8 % (ref 11.5–15.5)
WBC: 21.9 10*3/uL — ABNORMAL HIGH (ref 4.0–10.5)
nRBC: 0 % (ref 0.0–0.2)

## 2019-08-20 LAB — COMPREHENSIVE METABOLIC PANEL
ALT: 12 U/L (ref 0–44)
AST: 20 U/L (ref 15–41)
Albumin: 2.6 g/dL — ABNORMAL LOW (ref 3.5–5.0)
Alkaline Phosphatase: 61 U/L (ref 38–126)
Anion gap: 11 (ref 5–15)
BUN: 181 mg/dL — ABNORMAL HIGH (ref 8–23)
CO2: 23 mmol/L (ref 22–32)
Calcium: 8 mg/dL — ABNORMAL LOW (ref 8.9–10.3)
Chloride: 118 mmol/L — ABNORMAL HIGH (ref 98–111)
Creatinine, Ser: 3.87 mg/dL — ABNORMAL HIGH (ref 0.44–1.00)
GFR calc Af Amer: 12 mL/min — ABNORMAL LOW (ref >60)
GFR calc non Af Amer: 10 mL/min — ABNORMAL LOW (ref >60)
Glucose, Bld: 226 mg/dL — ABNORMAL HIGH (ref 70–99)
Potassium: 4.4 mmol/L (ref 3.5–5.1)
Sodium: 152 mmol/L — ABNORMAL HIGH (ref 135–145)
Total Bilirubin: 0.8 mg/dL (ref 0.3–1.2)
Total Protein: 6.1 g/dL — ABNORMAL LOW (ref 6.5–8.1)

## 2019-08-20 LAB — GLUCOSE, CAPILLARY
Glucose-Capillary: 142 mg/dL — ABNORMAL HIGH (ref 70–99)
Glucose-Capillary: 144 mg/dL — ABNORMAL HIGH (ref 70–99)

## 2019-08-20 LAB — PHOSPHORUS: Phosphorus: 4.2 mg/dL (ref 2.5–4.6)

## 2019-08-20 LAB — MAGNESIUM: Magnesium: 3.7 mg/dL — ABNORMAL HIGH (ref 1.7–2.4)

## 2019-08-20 LAB — FERRITIN: Ferritin: 209 ng/mL (ref 11–307)

## 2019-08-20 LAB — LACTIC ACID, PLASMA: Lactic Acid, Venous: 3 mmol/L (ref 0.5–1.9)

## 2019-08-20 LAB — D-DIMER, QUANTITATIVE: D-Dimer, Quant: 0.94 ug/mL-FEU — ABNORMAL HIGH (ref 0.00–0.50)

## 2019-08-20 LAB — C-REACTIVE PROTEIN: CRP: 4.1 mg/dL — ABNORMAL HIGH (ref <1.0)

## 2019-08-20 MED ORDER — LORAZEPAM 2 MG/ML PO CONC: 1.0000 mg | ORAL | Status: DC | PRN

## 2019-08-20 MED ORDER — MORPHINE 100MG IN NS 100ML (1MG/ML) PREMIX INFUSION
4.0000 mg/h | INTRAVENOUS | Status: DC
  Filled 2019-08-20: qty 100

## 2019-08-20 MED ORDER — GLYCOPYRROLATE 0.2 MG/ML IJ SOLN: 0.2000 mg | INTRAMUSCULAR | Status: DC | PRN

## 2019-08-20 MED ORDER — MORPHINE 100MG IN NS 100ML (1MG/ML) PREMIX INFUSION
4.0000 mg/h | INTRAVENOUS | Status: DC
  Administered 2019-08-20: 4 mg/h via INTRAVENOUS

## 2019-08-20 MED ORDER — HALOPERIDOL 0.5 MG PO TABS: 0.5000 mg | ORAL_TABLET | ORAL | Status: DC | PRN

## 2019-08-20 MED ORDER — LORAZEPAM 1 MG PO TABS: 1.0000 mg | ORAL_TABLET | ORAL | Status: DC | PRN

## 2019-08-20 MED ORDER — GLYCOPYRROLATE 1 MG PO TABS: 1.0000 mg | ORAL_TABLET | ORAL | Status: DC | PRN

## 2019-08-20 MED ORDER — HALOPERIDOL LACTATE 2 MG/ML PO CONC
0.5000 mg | ORAL | Status: DC | PRN
  Filled 2019-08-20: qty 0.3

## 2019-08-20 MED ORDER — LORAZEPAM 2 MG/ML IJ SOLN: 1.0000 mg | INTRAMUSCULAR | Status: DC | PRN

## 2019-08-20 MED ORDER — MORPHINE BOLUS VIA INFUSION
4.0000 mg | INTRAVENOUS | Status: DC | PRN
  Administered 2019-08-20 (×2): 4 mg via INTRAVENOUS
  Filled 2019-08-20: qty 4

## 2019-08-20 MED ORDER — HALOPERIDOL LACTATE 5 MG/ML IJ SOLN: 0.5000 mg | INTRAMUSCULAR | Status: DC | PRN

## 2019-08-20 MED ORDER — LACTATED RINGERS IV BOLUS
1000.0000 mL | Freq: Once | INTRAVENOUS | Status: AC
  Administered 2019-08-20: 1000 mL via INTRAVENOUS

## 2019-08-20 MED ORDER — LORAZEPAM 2 MG/ML IJ SOLN
0.5000 mg | Freq: Four times a day (QID) | INTRAMUSCULAR | Status: DC
  Administered 2019-08-20: 0.5 mg via INTRAVENOUS
  Filled 2019-08-20: qty 1

## 2019-08-20 MED ORDER — HYDROMORPHONE BOLUS VIA INFUSION
0.5000 mg | INTRAVENOUS | Status: DC | PRN
  Filled 2019-08-20: qty 1

## 2019-08-20 MED ORDER — SODIUM CHLORIDE 0.9 % IV SOLN
1.0000 mg/h | INTRAVENOUS | Status: DC
  Filled 2019-08-20: qty 5

## 2019-08-20 MED ORDER — MORPHINE SULFATE (PF) 2 MG/ML IV SOLN
2.0000 mg | Freq: Once | INTRAVENOUS | Status: AC
  Administered 2019-08-20: 2 mg via INTRAVENOUS
  Filled 2019-08-20: qty 1

## 2019-08-20 MED ORDER — MORPHINE BOLUS VIA INFUSION
4.0000 mg | INTRAVENOUS | Status: DC | PRN
  Filled 2019-08-20: qty 4

## 2019-08-24 LAB — CULTURE, BLOOD (ROUTINE X 2): Culture: NO GROWTH

## 2019-08-25 LAB — CULTURE, BLOOD (ROUTINE X 2): Culture: NO GROWTH

## 2019-08-29 NOTE — Progress Notes (Signed)
Patient ID: Garald BraverDoris E Mears, female   DOB: [birth year], 85 y.o.   MRN: [id number]  PROGRESS NOTE    Garald BraverDoris E Carelli  ZDG:[id number] DOB: [birth year] DOA: 2019-07-15 PCP: Joycelyn RuaMeyers, Stephen, MD   Brief Narrative:  85 year old female with history of dementia, hyperlipidemia, diabetes mellitus type 2, CVA unspecified, recent diagnosis of COVID-19 approximately 12 days ago presented on 2019-07-15 from her nursing facility with decreased responsiveness.  She was found to be hypoxic with leukocytosis, acute kidney injury with creatinine of 4.34.  She was started on IV Decadron along with IV fluids.  Assessment & Plan:   Acute hypoxic respiratory failure Sepsis: Present on admission COVID-19 infection Leukocytosis Acute metabolic encephalopathy in a patient with history of dementia Acute renal failure Probable UTI: Present on admission Hypernatremia/dehydration Metabolic acidosis with elevated anion gap Diabetes mellitus type 2 -Chest x-ray was clear on presentation.  Inflammatory markers were elevated. -Overall prognosis very poor.  Currently on IV Decadron. -Currently on nonrebreather and very tachypneic.  She is almost unresponsive and gasping for air -I called son/William Putzier on phone today and he wanted me to call daughter/Peggy Junita PushRodgers who is the power of attorney.  I subsequently called daughter Gigi Gineggy on phone and discussed with her patient's current extremely poor condition and extremely poor prognosis.  After discussion, daughter has agreed for total comfort measures.  Will discontinue further investigations, antibiotics, steroids and other treatments.  We will start morphine drip.    DVT prophylaxis: Discontinue for comfort measures Code Status: DNR Family Communication: As above Disposition Plan: Extremely poor.  Expect in-hospital death  Consultants: None  Procedures: None  Antimicrobials: Azactam from 2019-07-15 onwards   Subjective: Patient seen and examined at bedside.   She looks extremely ill and is very tachypneic and almost obtunded.  Objective: Vitals:   08/18/2019 0500 08/11/2019 0600 08/03/2019 0700 08/01/2019 0715  BP: (!) 105/51 (!) 97/43 (!) 90/41   Pulse: 94 95 87 93  Resp: (!) 24 (!) 25 (!) 26 (!) 25  Temp:   98.2 F (36.8 C)   TempSrc:   Rectal   SpO2: 100% 100% 98% 100%  Weight:      Height:        Intake/Output Summary (Last 24 hours) at 07/31/2019 0920 Last data filed at 08/10/2019 0900 Gross per 24 hour  Intake 4102.4 ml  Output -  Net 4102.4 ml   Filed Weights   12/09/18 1136 12/09/18 1951  Weight: 75.8 kg 41.9 kg    Examination:  General exam: Appears extremely ill.  Obtunded.  On nonrebreather.  Extremely thinly built. Respiratory system: Bilateral decreased breath sounds at bases with scattered crackles.  Tachypneic Cardiovascular system: S1 & S2 heard, Rate controlled Gastrointestinal system: Abdomen is nondistended, soft and nontender. Normal bowel sounds heard. Extremities: No cyanosis, clubbing, edema  Central nervous system:  obtunded.  No obvious focal neuro deficit  skin: No rashes, lesions or ulcers Psychiatry: Could not be assessed because of mental status    Data Reviewed: I have personally reviewed following labs and imaging studies  CBC: Recent Labs  Lab 12/09/18 1150 08/14/2019 0120  WBC 16.7* 21.9*  NEUTROABS 15.1* 19.6*  HGB 15.5* 13.3  HCT 50.5* 42.2  MCV 99.6 102.2*  PLT 237 206   Basic Metabolic Panel: Recent Labs  Lab 12/09/18 1150 12/09/18 2039 08/19/2019 0120  NA 160* 155* 152*  K 5.4* 5.4* 4.4  CL 119* 120* 118*  CO2 17* 25 23  GLUCOSE 159* 353* 226*  BUN 178*  177* 181*  CREATININE 4.34* 4.33* 3.87*  CALCIUM 9.2 8.2* 8.0*  MG  --   --  3.7*  PHOS  --   --  4.2   GFR: Estimated Creatinine Clearance: 7.2 mL/min (A) (by C-G formula based on SCr of 3.87 mg/dL (H)). Liver Function Tests: Recent Labs  Lab 09-07-2019 1150 07/31/2019 0120  AST 21 20  ALT 14 12  ALKPHOS 72 61  BILITOT  1.2 0.8  PROT 7.5 6.1*  ALBUMIN 3.3* 2.6*   No results for input(s): LIPASE, AMYLASE in the last 168 hours. No results for input(s): AMMONIA in the last 168 hours. Coagulation Profile: No results for input(s): INR, PROTIME in the last 168 hours. Cardiac Enzymes: No results for input(s): CKTOTAL, CKMB, CKMBINDEX, TROPONINI in the last 168 hours. BNP (last 3 results) No results for input(s): PROBNP in the last 8760 hours. HbA1C: No results for input(s): HGBA1C in the last 72 hours. CBG: Recent Labs  Lab September 07, 2019 1828 Sep 07, 2019 1953 08/22/2019 0418 08/09/2019 0847  GLUCAP 260* 302* 142* 144*   Lipid Profile: Recent Labs    09/07/19 1150  TRIG 277*   Thyroid Function Tests: No results for input(s): TSH, T4TOTAL, FREET4, T3FREE, THYROIDAB in the last 72 hours. Anemia Panel: Recent Labs    2019-09-07 1150 08/18/2019 0120  FERRITIN 200 209   Sepsis Labs: Recent Labs  Lab 09/07/19 1150 09/07/2019 2039 08/04/2019 0120  PROCALCITON 0.12  --   --   LATICACIDVEN 2.2* 2.1* 3.0*    Recent Results (from the past 240 hour(s))  Blood Culture (routine x 2)     Status: None (Preliminary result)   Collection Time: 09/07/19  1:23 PM   Specimen: BLOOD  Result Value Ref Range Status   Specimen Description BLOOD LEFT ANTECUBITAL  Final   Special Requests   Final    BOTTLES DRAWN AEROBIC AND ANAEROBIC Blood Culture results may not be optimal due to an inadequate volume of blood received in culture bottles   Culture   Final    NO GROWTH < 24 HOURS Performed at [facility], [place] [address] [place], [place] [address]    Report Status PENDING  Incomplete  SARS CORONAVIRUS 2 (TAT 6-24 HRS) Nasopharyngeal Nasopharyngeal Swab     Status: Abnormal   Collection Time: Sep 07, 2019  2:25 PM   Specimen: Nasopharyngeal Swab  Result Value Ref Range Status   SARS Coronavirus 2 POSITIVE (A) NEGATIVE Final    Comment: RESULT CALLED TO, READ BACK BY AND VERIFIED WITH: A.WHITE,RN 1937 [id number] I.MANNING  (NOTE) SARS-CoV-2 target nucleic acids are DETECTED. The SARS-CoV-2 RNA is generally detectable in upper and lower respiratory specimens during the acute phase of infection. Positive results are indicative of active infection with SARS-CoV-2. Clinical  correlation with patient history and other diagnostic information is necessary to determine patient infection status. Positive results do  not rule out bacterial infection or co-infection with other viruses. The expected result is Negative. Fact Sheet for Patients: SugarRoll.be Fact Sheet for Healthcare Providers: https://www.woods-mathews.com/ This test is not yet approved or cleared by the [place] FDA and  has been authorized for detection and/or diagnosis of SARS-CoV-2 by FDA under an Emergency Use Authorization (EUA). This EUA will remain  in effect (meaning this test can be used) for th e duration of the COVID-19 declaration under Section 564(b)(1) of the Act, 21 U.S.C. section 360bbb-3(b)(1), unless the authorization is terminated or revoked sooner. Performed at [facility], [place],  [place] [address]   MRSA PCR Screening     Status: Abnormal   Collection Time: 09/14/19  8:28 PM   Specimen: Nasal Mucosa; Nasopharyngeal  Result Value Ref Range Status   MRSA by PCR POSITIVE (A) NEGATIVE Final    Comment:        The GeneXpert MRSA Assay (FDA approved for NASAL specimens only), is one component of a comprehensive MRSA colonization surveillance program. It is not intended to diagnose MRSA infection nor to guide or monitor treatment for MRSA infections. RESULT CALLED TO, READ BACK BY AND VERIFIED WITH: A TOPOLSKI RN 2019-09-14 2231 JDW Performed at [facility], [address], [place], [place] [address]          Radiology Studies: Ct Head Wo Contrast  Result Date: 08/14/2019 CLINICAL DATA:  Unexplained altered level of consciousness EXAM: CT  HEAD WITHOUT CONTRAST TECHNIQUE: Contiguous axial images were obtained from the base of the skull through the vertex without intravenous contrast. COMPARISON:  01/24/2015 FINDINGS: Brain: No evidence of acute infarction, hemorrhage, hydrocephalus, extra-axial collection or mass lesion/mass effect. Chronic bilateral parasagittal occipital lobe infarcts, interval on the left. There has likely also been interval ischemic injury to the posterior left thalamus. Remote lacunar/perforator infarct at the right thalamus. Confluent low-density in the cerebral white matter. Cerebral volume loss with prominent medial temporal, likely Alzheimer's disease in this patient with chart history of dementia Vascular: No hyperdense vessel or unexpected calcification. Skull: Normal. Negative for fracture or focal lesion. Sinuses/Orbits: No acute finding. IMPRESSION: 1. No acute finding. 2. Extensive chronic ischemic injury with progression from 2016. 3. Atrophy correlating with history of dementia Electronically Signed   By: Marnee Spring M.D.   On: 08/24/2019 04:23   US Renal  Result Date: 09-14-2019 CLINICAL DATA:  Acute kidney injury EXAM: RENAL / URINARY TRACT ULTRASOUND COMPLETE COMPARISON:  CT 03/28/2007 FINDINGS: Right Kidney: Renal measurements: 6.7 x 3.5 x 4.6 cm = volume: 56.7 mL . Echogenicity within normal limits. No mass or hydronephrosis visualized. Left Kidney: Renal measurements: 6.9 x 4.1 x 4.4 cm = volume: 64.8 mL. Cortical echogenicity normal. No hydronephrosis. Cyst off the mid to upper pole measuring 4.6 x 3.7 x 3.8 cm. Bladder: Decompressed by Foley catheter Other: None IMPRESSION: 1. Somewhat small appearing kidneys, possibly due to atrophy or thinning at the poles. No hydronephrosis. 2. 4.6 cm left renal cyst Electronically Signed   By: Jasmine Pang M.D.   On: 2019-09-14 22:20   Dg Chest Port 1 View  Result Date: 2019/09/14 CLINICAL DATA:  COVID-19 positive with worsening symptoms, initial encounter  EXAM: PORTABLE CHEST 1 VIEW COMPARISON:  01/09/2015 FINDINGS: Cardiac shadows within normal limits. Aortic calcifications are seen. The lungs are well aerated bilaterally. No focal infiltrate or sizable effusion is noted. Stable calcified granuloma is noted in the left lung. No bony abnormality is seen. IMPRESSION: No acute abnormality noted. Electronically Signed   By: Alcide Clever M.D.   On: 09/14/2019 12:31        Scheduled Meds: . chlorhexidine  15 mL Mouth Rinse BID  . Chlorhexidine Gluconate Cloth  6 each Topical Q0600  . mouth rinse  15 mL Mouth Rinse q12n4p  . sodium chloride flush  3 mL Intravenous Q12H   Continuous Infusions: . morphine            Glade Lloyd, MD [facility] 08/27/2019, 9:20 AM

## 2019-08-29 NOTE — Progress Notes (Signed)
On call provider [place] contacted via page: critical result lactic 3.0

## 2019-08-29 NOTE — Progress Notes (Signed)
Warming blanket applied.

## 2019-08-29 NOTE — Progress Notes (Signed)
Nutrition Brief Note  Chart reviewed. Pt now transitioning to comfort care.  No further nutrition interventions warranted at this time.  Please re-consult as needed.   Caidence Higashi A. Buddie Marston, RD, LDN, CDCES Registered Dietitian II Certified Diabetes Care and Education Specialist Pager: [phone] After hours Pager: [phone]  

## 2019-08-29 NOTE — Consult Note (Addendum)
Consultation Note Date: 09/08/19   Patient Name: Theresa Sexton  DOB: 1932-12-27  MRN: [id number]  Age / Sex: 85 y.o., female  PCP: Orpah Melter, MD Referring Physician: Aline August, MD  Reason for Consultation: Non pain symptom management, Psychosocial/spiritual support, Terminal Care and Withdrawal of life-sustaining treatment  HPI/Patient Profile: 85 y.o. female  with past medical history of dementia, DM2, CVA, HTN, HLD who was admitted on 08/18/2019 with altered mental status. The patient had tested positive for COVID at her nursing facility 10 days prior to admission.  Clinical Assessment and Goals of Care:  I have reviewed medical records including EPIC notes, labs and imaging, received report from the bedside RN and then spoke with her daughter Vickii Chafe on the phone to discuss prognosis, [place], EOL wishes, disposition and options.  I introduced Palliative Medicine as specialized medical care for people living with serious illness. It focuses on providing relief from the symptoms and stress of a serious illness.   We discussed a brief life review of the patient. Peggy mentioned her mother had lived in the SNF for the past 2 years.  She loved old shows on TV like [place] and she loved Elvis music.   She is Catholic but stopped going to church when she developed dementia.  I asked questions to confirm that Vickii Chafe understood her mother is dying.  Peggy stated that it is really hard being the decision maker.  She has never had to do it before.  She feels there is no one there to help her.  She asked about what happens after her mother dies.  We talked about the next step being to contact a funeral home or cremation service.  Peggy lamented that her mother left her no instructions and the family has no money.  We discussed cremation as being very popular these days and it is much less expensive.    I gave  Vickii Chafe space and time to share her feelings and concerns.      Primary Decision Maker:  NEXT OF KIN Daughter Peggy    SUMMARY OF RECOMMENDATIONS    Orders adjusted to add morphine bolus and scheduled ativan Discussed with RN Katie and encouraged her to call with any patient needs.  Code Status/Advance Care Planning:  DNR   Symptom Management:   Added prn opioid bolus and scheduled ativan  If patient lingers will change morphine to dilaudid given risk of delirium.  At this point RN feels patient will pass very quickly.  Additional Recommendations (Limitations, Scope, Preferences):  Full Comfort Care  Palliative Prophylaxis:   Frequent Pain Assessment  Psycho-social/Spiritual:   Desire for further Chaplaincy support: requested for sacrament of the sick if possible  Prognosis:  Hours.    Discharge Planning: Anticipated Hospital Death      Primary Diagnoses: Present on Admission: . Acute metabolic encephalopathy . Sepsis ([place]) . Abnormal urinalysis . Dementia without behavioral disturbance ([place]) . Hypernatremia . COVID-19 virus infection . Acute respiratory failure with hypoxia ([place]) . Acute renal failure (ARF) ([place]) .  Hyperkalemia   I have reviewed the medical record, interviewed the patient and family, and examined the patient. The following aspects are pertinent.  Past Medical History:  Diagnosis Date  . Diabetes mellitus   . High cholesterol   . Hypertension    Social History   Socioeconomic History  . Marital status: Divorced    Spouse name: Not on file  . Number of children: Not on file  . Years of education: Not on file  . Highest education level: Not on file  Occupational History  . Not on file  Social Needs  . Financial resource strain: Not on file  . Food insecurity    Worry: Not on file    Inability: Not on file  . Transportation needs    Medical: Not on file    Non-medical: Not on file  Tobacco Use  . Smoking status: Never  Smoker  . Smokeless tobacco: Never Used  Substance and Sexual Activity  . Alcohol use: No  . Drug use: No  . Sexual activity: Never    Birth control/protection: Post-menopausal  Lifestyle  . Physical activity    Days per week: Not on file    Minutes per session: Not on file  . Stress: Not on file  Relationships  . Social Musician on phone: Not on file    Gets together: Not on file    Attends religious service: Not on file    Active member of club or organization: Not on file    Attends meetings of clubs or organizations: Not on file    Relationship status: Not on file  Other Topics Concern  . Not on file  Social History Narrative  . Not on file   History reviewed. No pertinent family history. Scheduled Meds: . chlorhexidine  15 mL Mouth Rinse BID  . Chlorhexidine Gluconate Cloth  6 each Topical Q0600  . LORazepam  0.5 mg Intravenous Q6H  . mouth rinse  15 mL Mouth Rinse q12n4p  . sodium chloride flush  3 mL Intravenous Q12H   Continuous Infusions: . morphine     PRN Meds:.acetaminophen, glycopyrrolate **OR** glycopyrrolate **OR** glycopyrrolate, haloperidol **OR** haloperidol **OR** haloperidol lactate, LORazepam **OR** LORazepam **OR** LORazepam, morphine, ondansetron **OR** ondansetron (ZOFRAN) IV Allergies  Allergen Reactions  . Penicillins Hives    Reaction unkown - nursing home patient Per [facility]: Hives    Vital Signs: BP (!) 90/41   Pulse 93   Temp 98.2 F (36.8 C) (Rectal)   Resp (!) 25   Ht 5' 4.5" (1.638 m)   Wt 41.9 kg   SpO2 100%   BMI 15.61 kg/m  Pain Scale: 0-10   Pain Score: Asleep   SpO2: SpO2: 100 % O2 Device:SpO2: 100 % O2 Flow Rate: .O2 Flow Rate (L/min): 15 L/min  IO: Intake/output summary:   Intake/Output Summary (Last 24 hours) at 09/05/2019 1118 Last data filed at September 05, 2019 0900 Gross per 24 hour  Intake 4102.4 ml  Output -  Net 4102.4 ml    LBM: Last BM Date: (unknown) Baseline Weight: Weight: 75.8 kg Most  recent weight: Weight: 41.9 kg     Palliative Assessment/Data: 10%     Time In: 10:30 Time Out: 11:20 Time Total: 50 min. Visit consisted of counseling and education dealing with the complex and emotionally intense issues surrounding the need for palliative care and symptom management in the setting of serious and potentially life-threatening illness. Greater than 50%  of this time was spent counseling  and coordinating care related to the above assessment and plan.  The above conversation was completed via telephone due to the restrictions during the COVID-19 pandemic. Thorough chart review and discussion with necessary members of the care team was completed as part of assessment. All issues were discussed and addressed but no physical exam was performed.  Signed by: Norvel RichardsMarianne Lateria Alderman, PA-C Palliative Medicine Pager: [phone]  Please contact Palliative Medicine Team phone at [phone] for questions and concerns.  For individual provider: See Loretha StaplerAmion

## 2019-08-29 NOTE — Death Summary Note (Signed)
Death Summary  Theresa Sexton HQP:[id number] DOB: 1933/09/08 DOA: August 22, 2019  PCP: Joycelyn Rua, MD  Admit date: 08/22/2019 Date of Death: 2019/08/23 Time of Death: February 16, 1403   History of present illness:  85 year old female with history of dementia, hyperlipidemia, diabetes mellitus type 2, CVA unspecified, recent diagnosis of COVID-19 approximately 12 days ago presented on Aug 22, 2019 from her nursing facility with decreased responsiveness.  She was found to be hypoxic with leukocytosis, acute kidney injury with creatinine of 4.34.  She was started on IV Decadron along with IV fluids.  The hospitalization, her condition did not improve and progressively got worse.  After discussion with family members, she was made comfort measures.  She expired on 08-23-2019 at 1404.   Final Diagnoses:  Acute hypoxic respiratory failure Sepsis: Present on admission COVID-19 infection Leukocytosis Acute metabolic encephalopathy in a patient with history of dementia Acute renal failure Probable UTI: Present on admission Hypernatremia/dehydration Metabolic acidosis with elevated anion gap Diabetes mellitus type 2   The results of significant diagnostics from this hospitalization (including imaging, microbiology, ancillary and laboratory) are listed below for reference.    Significant Diagnostic Studies: Ct Head Wo Contrast  Result Date: August 23, 2019 CLINICAL DATA:  Unexplained altered level of consciousness EXAM: CT HEAD WITHOUT CONTRAST TECHNIQUE: Contiguous axial images were obtained from the base of the skull through the vertex without intravenous contrast. COMPARISON:  01/24/2015 FINDINGS: Brain: No evidence of acute infarction, hemorrhage, hydrocephalus, extra-axial collection or mass lesion/mass effect. Chronic bilateral parasagittal occipital lobe infarcts, interval on the left. There has likely also been interval ischemic injury to the posterior left thalamus. Remote lacunar/perforator infarct  at the right thalamus. Confluent low-density in the cerebral white matter. Cerebral volume loss with prominent medial temporal, likely Alzheimer's disease in this patient with chart history of dementia Vascular: No hyperdense vessel or unexpected calcification. Skull: Normal. Negative for fracture or focal lesion. Sinuses/Orbits: No acute finding. IMPRESSION: 1. No acute finding. 2. Extensive chronic ischemic injury with progression from Feb 16, 2015. 3. Atrophy correlating with history of dementia Electronically Signed   By: Marnee Spring M.D.   On: August 23, 2019 04:23   US Renal  Result Date: 08/22/19 CLINICAL DATA:  Acute kidney injury EXAM: RENAL / URINARY TRACT ULTRASOUND COMPLETE COMPARISON:  CT 03/28/2007 FINDINGS: Right Kidney: Renal measurements: 6.7 x 3.5 x 4.6 cm = volume: 56.7 mL . Echogenicity within normal limits. No mass or hydronephrosis visualized. Left Kidney: Renal measurements: 6.9 x 4.1 x 4.4 cm = volume: 64.8 mL. Cortical echogenicity normal. No hydronephrosis. Cyst off the mid to upper pole measuring 4.6 x 3.7 x 3.8 cm. Bladder: Decompressed by Foley catheter Other: None IMPRESSION: 1. Somewhat small appearing kidneys, possibly due to atrophy or thinning at the poles. No hydronephrosis. 2. 4.6 cm left renal cyst Electronically Signed   By: Jasmine Pang M.D.   On: 2019/08/22 22:20   Dg Chest Port 1 View  Result Date: 08-22-19 CLINICAL DATA:  COVID-19 positive with worsening symptoms, initial encounter EXAM: PORTABLE CHEST 1 VIEW COMPARISON:  01/09/2015 FINDINGS: Cardiac shadows within normal limits. Aortic calcifications are seen. The lungs are well aerated bilaterally. No focal infiltrate or sizable effusion is noted. Stable calcified granuloma is noted in the left lung. No bony abnormality is seen. IMPRESSION: No acute abnormality noted. Electronically Signed   By: Alcide Clever M.D.   On: 08-22-2019 12:31    Microbiology: Recent Results (from the past 240 hour(s))  Blood Culture  (routine x 2)     Status: None (Preliminary  result)   Collection Time: 09-17-2019  1:23 PM   Specimen: BLOOD  Result Value Ref Range Status   Specimen Description BLOOD LEFT ANTECUBITAL  Final   Special Requests   Final    BOTTLES DRAWN AEROBIC AND ANAEROBIC Blood Culture results may not be optimal due to an inadequate volume of blood received in culture bottles   Culture   Final    NO GROWTH < 24 HOURS Performed at [facility] [address] [place], [place] [address]    Report Status PENDING  Incomplete  SARS CORONAVIRUS 2 (TAT 6-24 HRS) Nasopharyngeal Nasopharyngeal Swab     Status: Abnormal   Collection Time: 2019/09/17  2:25 PM   Specimen: Nasopharyngeal Swab  Result Value Ref Range Status   SARS Coronavirus 2 POSITIVE (A) NEGATIVE Final    Comment: RESULT CALLED TO, READ BACK BY AND VERIFIED WITH: A.WHITE,RN [id number] I.MANNING (NOTE) SARS-CoV-2 target nucleic acids are DETECTED. The SARS-CoV-2 RNA is generally detectable in upper and lower respiratory specimens during the acute phase of infection. Positive results are indicative of active infection with SARS-CoV-2. Clinical  correlation with patient history and other diagnostic information is necessary to determine patient infection status. Positive results do  not rule out bacterial infection or co-infection with other viruses. The expected result is Negative. Fact Sheet for Patients: SugarRoll.be Fact Sheet for Healthcare Providers: https://www.woods-mathews.com/ This test is not yet approved or cleared by the [place] FDA and  has been authorized for detection and/or diagnosis of SARS-CoV-2 by FDA under an Emergency Use Authorization (EUA). This EUA will remain  in effect (meaning this test can be used) for th e duration of the COVID-19 declaration under Section 564(b)(1) of the Act, 21 U.S.C. section 360bbb-3(b)(1), unless the authorization is terminated or  revoked sooner. Performed at [facility], [place] [address] [place], [place] [address]   MRSA PCR Screening     Status: Abnormal   Collection Time: 2019-09-17  8:28 PM   Specimen: Nasal Mucosa; Nasopharyngeal  Result Value Ref Range Status   MRSA by PCR POSITIVE (A) NEGATIVE Final    Comment:        The GeneXpert MRSA Assay (FDA approved for NASAL specimens only), is one component of a comprehensive MRSA colonization surveillance program. It is not intended to diagnose MRSA infection nor to guide or monitor treatment for MRSA infections. RESULT CALLED TO, READ BACK BY AND VERIFIED WITH: A TOPOLSKI RN 09-17-19 2231 JDW Performed at [facility], [place] [address], [place], [place] [address]      Labs: Basic Metabolic Panel: Recent Labs  Lab 09/17/19 1150 2019-09-17 2039 08/28/2019 0120  NA 160* 155* 152*  K 5.4* 5.4* 4.4  CL 119* 120* 118*  CO2 17* 25 23  GLUCOSE 159* 353* 226*  BUN 178* 177* 181*  CREATININE 4.34* 4.33* 3.87*  CALCIUM 9.2 8.2* 8.0*  MG  --   --  3.7*  PHOS  --   --  4.2   Liver Function Tests: Recent Labs  Lab 09-17-19 1150 08/24/2019 0120  AST 21 20  ALT 14 12  ALKPHOS 72 61  BILITOT 1.2 0.8  PROT 7.5 6.1*  ALBUMIN 3.3* 2.6*   No results for input(s): LIPASE, AMYLASE in the last 168 hours. No results for input(s): AMMONIA in the last 168 hours. CBC: Recent Labs  Lab 2019-09-17 1150 08/11/2019 0120  WBC 16.7* 21.9*  NEUTROABS 15.1* 19.6*  HGB 15.5* 13.3  HCT 50.5*  42.2  MCV 99.6 102.2*  PLT 237 206   Cardiac Enzymes: No results for input(s): CKTOTAL, CKMB, CKMBINDEX, TROPONINI in the last 168 hours. D-Dimer Recent Labs    08/27/2019 1150 2019/07/09 0120  DDIMER 1.16* 0.94*   BNP: Invalid input(s): POCBNP CBG: Recent Labs  Lab 08/06/2019 1828 08/02/2019 1953 2019/07/09 0418 2019/07/09 0847  GLUCAP 260* 302* 142* 144*   Anemia work up Recent Labs    08/05/2019 1150 2019/07/09 0120  FERRITIN 200 209   Urinalysis    Component  Value Date/Time   COLORURINE AMBER (A) 08/03/2019 1835   APPEARANCEUR HAZY (A) 07/30/2019 1835   LABSPEC 1.023 08/05/2019 1835   PHURINE 5.0 08/25/2019 1835   GLUCOSEU NEGATIVE 08/27/2019 1835   HGBUR NEGATIVE 08/10/2019 1835   BILIRUBINUR SMALL (A) 08/09/2019 1835   KETONESUR NEGATIVE 08/06/2019 1835   PROTEINUR 30 (A) 08/11/2019 1835   NITRITE NEGATIVE 08/08/2019 1835   LEUKOCYTESUR MODERATE (A) 08/22/2019 1835   Sepsis Labs Invalid input(s): PROCALCITONIN,  WBC,  LACTICIDVEN     SIGNED:  Glade LloydKshitiz Justyce Baby, MD  [facility] 2020/06/1119, 2:46 PM

## 2019-08-29 NOTE — Progress Notes (Signed)
TOD 1402. MD and family notified. Morphine gtt wasted with Kristine Hutchens.

## 2019-08-29 DEATH — deceased

## 2021-05-09 IMAGING — US US RENAL
1 series · 14 of 25 positions shown · non-contrast
Comparison: CT 03/28/2007

CLINICAL DATA: Acute kidney injury

EXAM:
RENAL / URINARY TRACT ULTRASOUND COMPLETE

[Series 1: us renal · 14 of 38 slices shown]
[im 1/38]
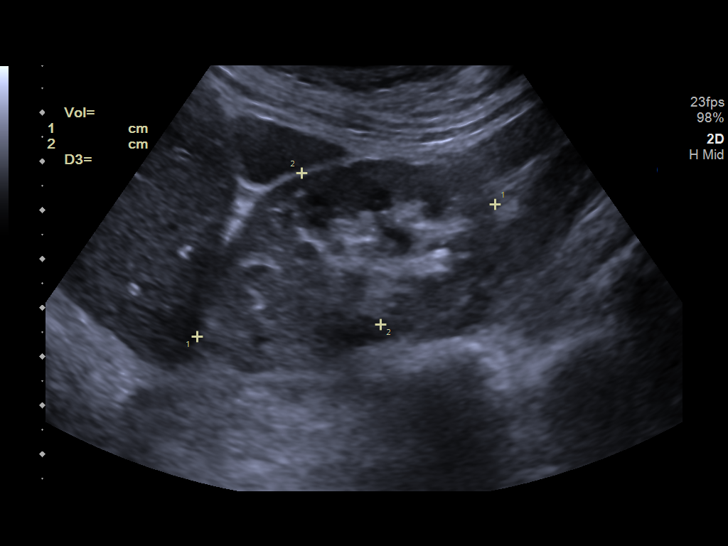
[im 4/38]
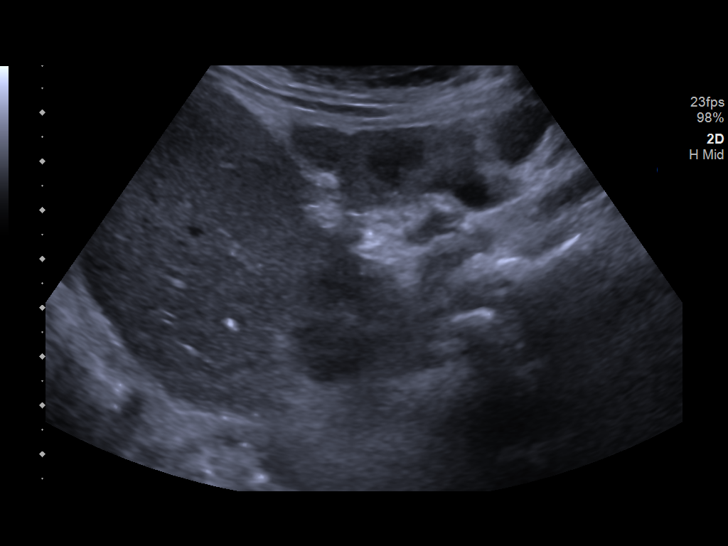
[im 7/38]
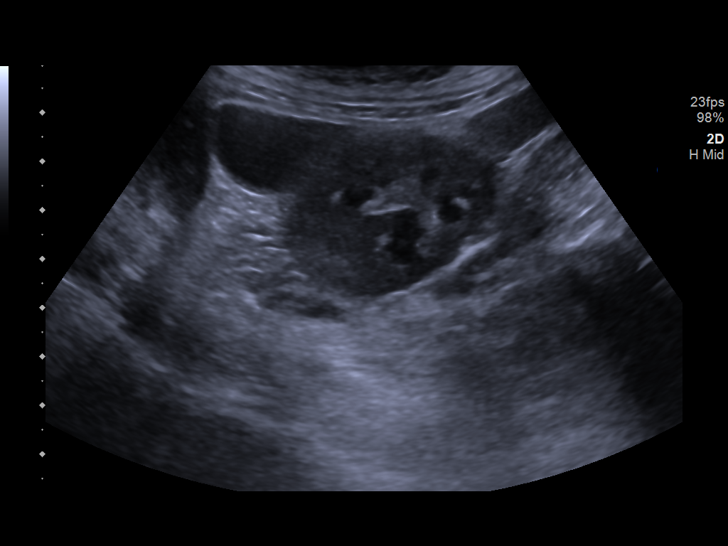
[im 10/38]
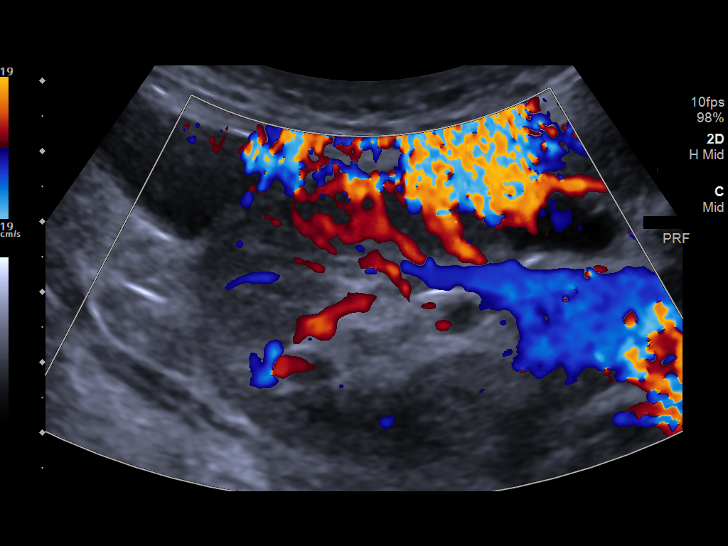
[im 13/38]
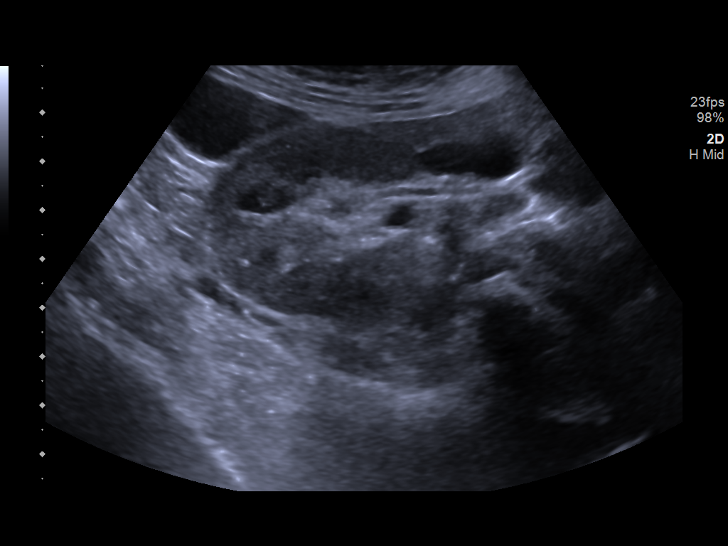
[im 14/38]
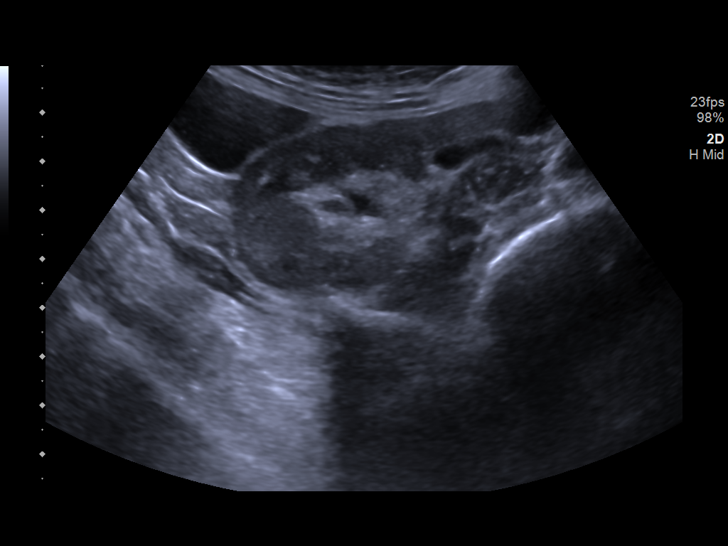
[im 17/38]
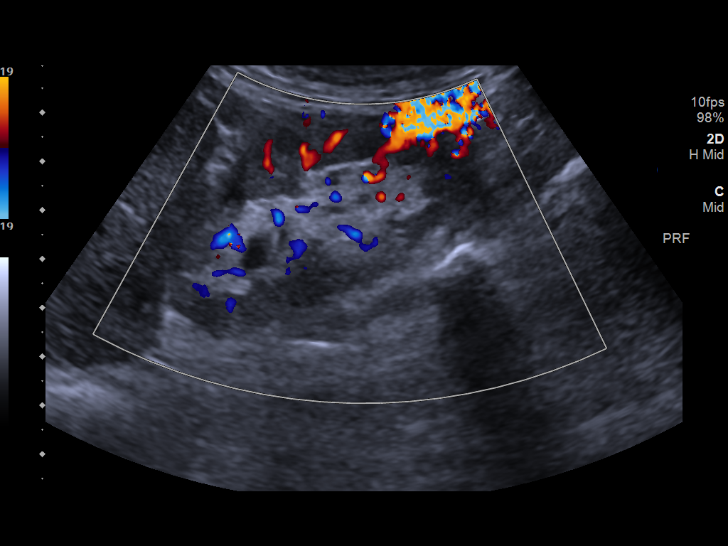
[im 21/38]
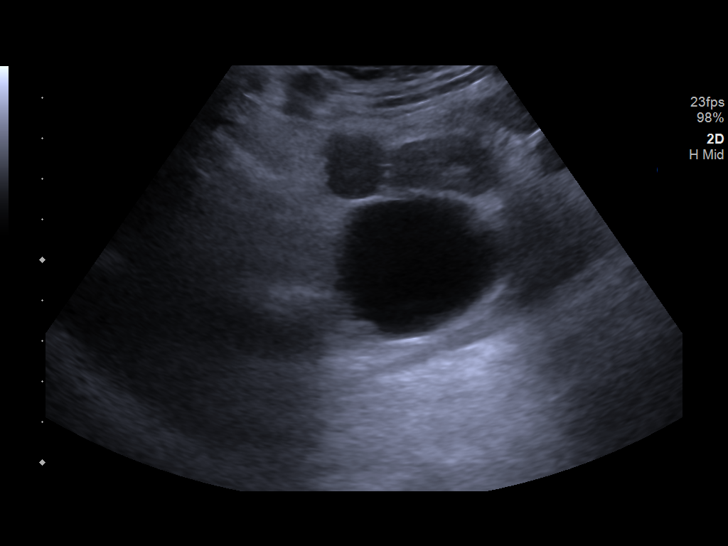
[im 24/38]
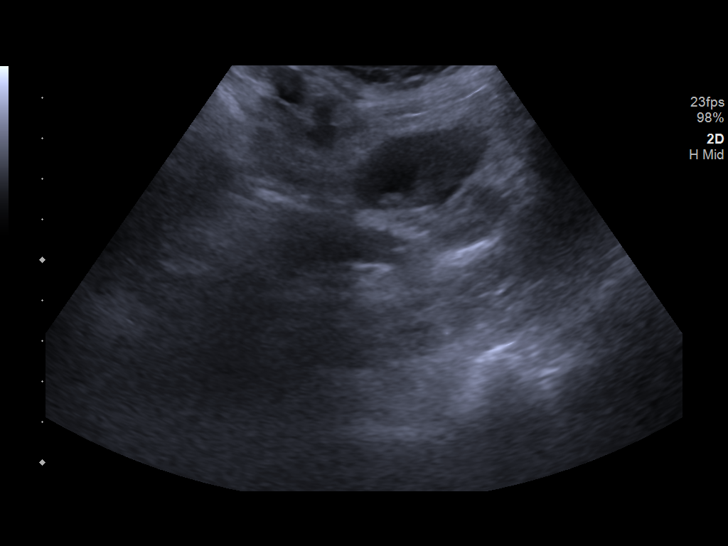
[im 25/38]
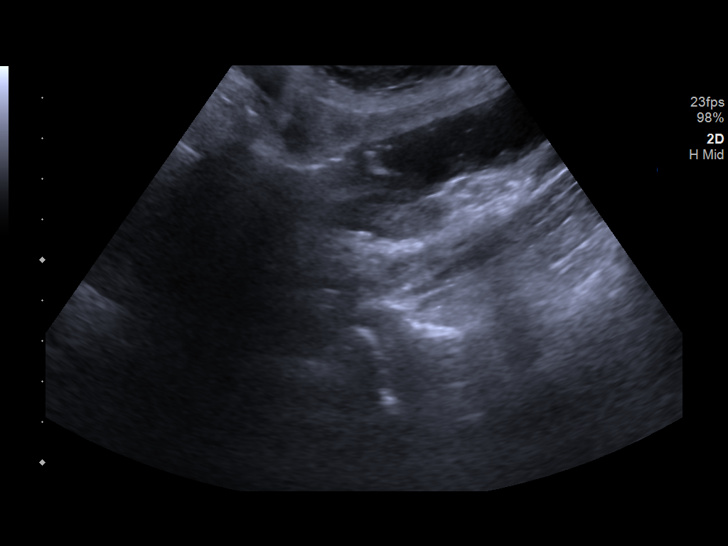
[im 28/38]
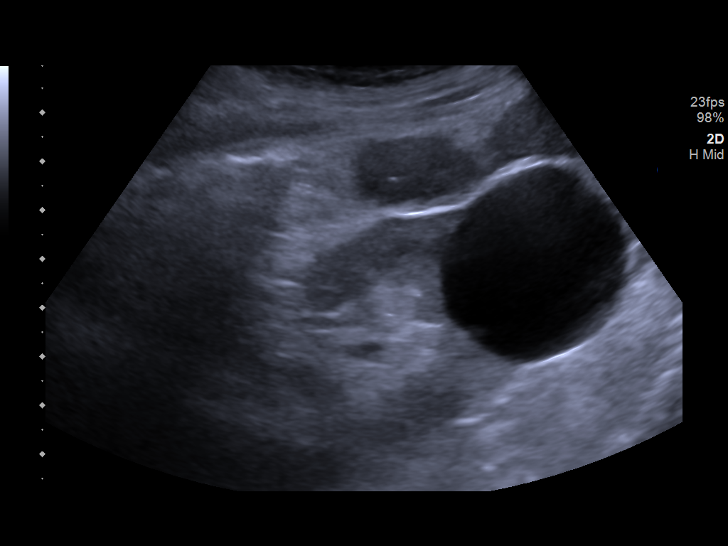
[im 31/38]
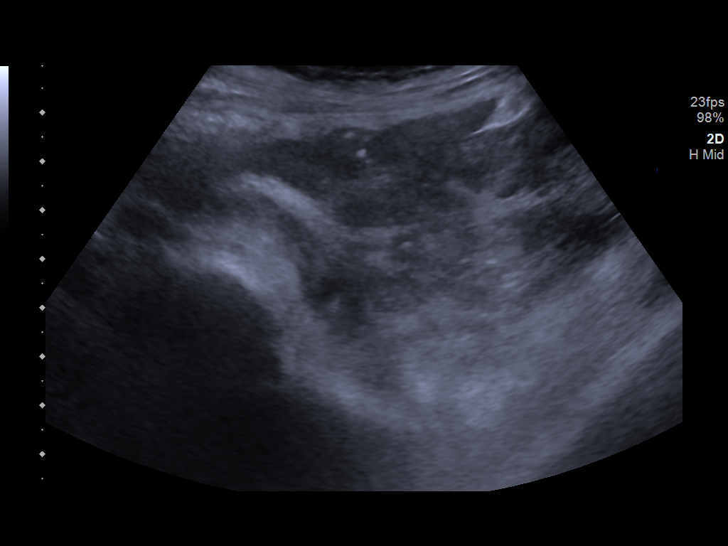
[im 34/38]
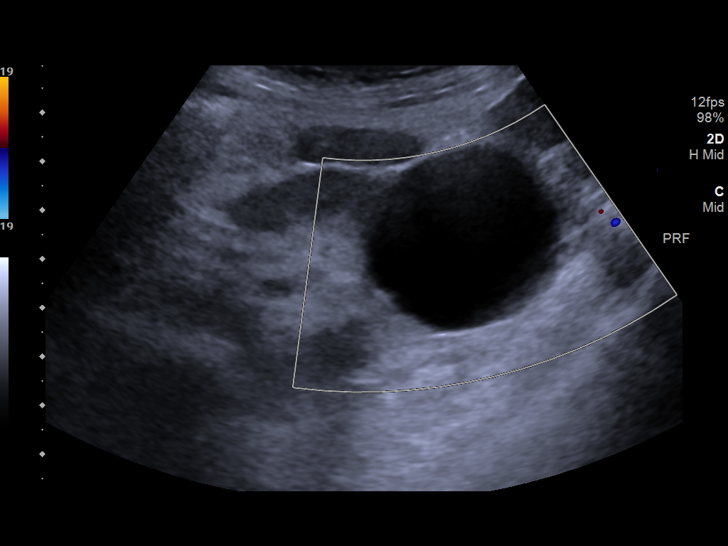
[im 38/38]
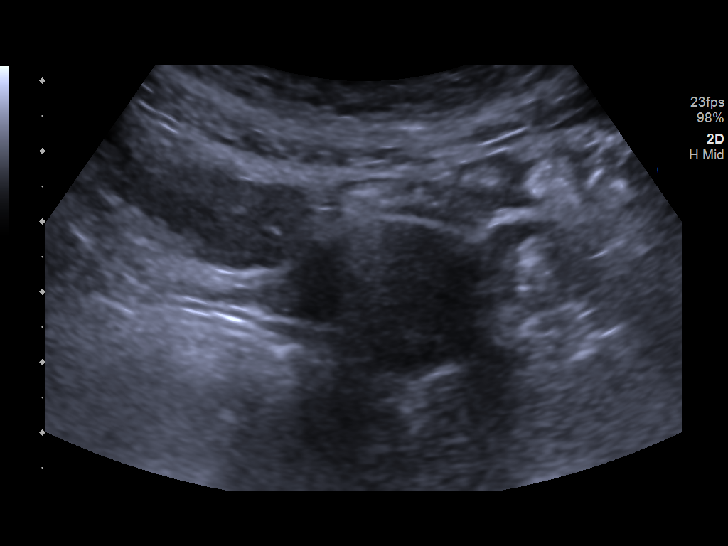

[14 of 25 positions shown; findings below may reference images not displayed]

FINDINGS: Right Kidney:

Renal measurements: 6.7 x 3.5 x 4.6 cm = volume: 56.7 mL .
Echogenicity within normal limits. No mass or hydronephrosis
visualized.

Left Kidney:

Renal measurements: 6.9 x 4.1 x 4.4 cm = volume: 64.8 mL. Cortical
echogenicity normal. No hydronephrosis. Cyst off the mid to upper
pole measuring 4.6 x 3.7 x 3.8 cm.

Bladder:

Decompressed by Foley catheter

Other:

None
IMPRESSION: 1. Somewhat small appearing kidneys, possibly due to atrophy or
thinning at the poles. No hydronephrosis.
2. 4.6 cm left renal cyst
